# Patient Record
Sex: Male | Born: 1945 | Race: White | Hispanic: No | Marital: Married | Smoking: Former smoker
Health system: Southern US, Community
[De-identification: ages and names within clinical notes are randomized; demographics above are authoritative.]

## PROBLEM LIST (undated history)

## (undated) DIAGNOSIS — D509 Iron deficiency anemia, unspecified: Secondary | ICD-10-CM

## (undated) DIAGNOSIS — N201 Calculus of ureter: Secondary | ICD-10-CM

## (undated) DIAGNOSIS — N200 Calculus of kidney: Secondary | ICD-10-CM

## (undated) DIAGNOSIS — Z87448 Personal history of other diseases of urinary system: Secondary | ICD-10-CM

## (undated) DIAGNOSIS — I739 Peripheral vascular disease, unspecified: Secondary | ICD-10-CM

## (undated) DIAGNOSIS — N289 Disorder of kidney and ureter, unspecified: Secondary | ICD-10-CM

## (undated) DIAGNOSIS — Z794 Long term (current) use of insulin: Secondary | ICD-10-CM

## (undated) DIAGNOSIS — R531 Weakness: Secondary | ICD-10-CM

## (undated) DIAGNOSIS — M48061 Spinal stenosis, lumbar region without neurogenic claudication: Secondary | ICD-10-CM

## (undated) DIAGNOSIS — D75839 Thrombocytosis, unspecified: Secondary | ICD-10-CM

## (undated) DIAGNOSIS — N402 Nodular prostate without lower urinary tract symptoms: Secondary | ICD-10-CM

## (undated) DIAGNOSIS — L97919 Non-pressure chronic ulcer of unspecified part of right lower leg with unspecified severity: Secondary | ICD-10-CM

## (undated) DIAGNOSIS — E119 Type 2 diabetes mellitus without complications: Secondary | ICD-10-CM

## (undated) DIAGNOSIS — Z87442 Personal history of urinary calculi: Secondary | ICD-10-CM

## (undated) DIAGNOSIS — Z8619 Personal history of other infectious and parasitic diseases: Secondary | ICD-10-CM

## (undated) DIAGNOSIS — D473 Essential (hemorrhagic) thrombocythemia: Secondary | ICD-10-CM

## (undated) DIAGNOSIS — IMO0002 Reserved for concepts with insufficient information to code with codable children: Secondary | ICD-10-CM

## (undated) DIAGNOSIS — N1330 Unspecified hydronephrosis: Secondary | ICD-10-CM

## (undated) DIAGNOSIS — I10 Essential (primary) hypertension: Secondary | ICD-10-CM

---

## 2006-08-07 ENCOUNTER — Emergency Department (HOSPITAL_COMMUNITY): Admission: EM | Admit: 2006-08-07 | Discharge: 2006-08-07 | Payer: Self-pay | Admitting: Emergency Medicine

## 2007-03-13 ENCOUNTER — Inpatient Hospital Stay (HOSPITAL_COMMUNITY): Admission: EM | Admit: 2007-03-13 | Discharge: 2007-03-17 | Payer: Self-pay | Admitting: Emergency Medicine

## 2007-03-15 ENCOUNTER — Encounter (INDEPENDENT_AMBULATORY_CARE_PROVIDER_SITE_OTHER): Payer: Self-pay | Admitting: Specialist

## 2007-03-15 HISTORY — PX: OTHER SURGICAL HISTORY: SHX169

## 2007-04-11 ENCOUNTER — Encounter (HOSPITAL_BASED_OUTPATIENT_CLINIC_OR_DEPARTMENT_OTHER): Admission: RE | Admit: 2007-04-11 | Discharge: 2007-04-29 | Payer: Self-pay | Admitting: Surgery

## 2007-07-28 ENCOUNTER — Inpatient Hospital Stay (HOSPITAL_COMMUNITY): Admission: EM | Admit: 2007-07-28 | Discharge: 2007-07-30 | Payer: Self-pay | Admitting: Emergency Medicine

## 2007-08-19 ENCOUNTER — Ambulatory Visit: Payer: Self-pay | Admitting: Cardiology

## 2007-08-19 ENCOUNTER — Inpatient Hospital Stay (HOSPITAL_COMMUNITY): Admission: EM | Admit: 2007-08-19 | Discharge: 2007-08-26 | Payer: Self-pay | Admitting: Emergency Medicine

## 2007-08-21 ENCOUNTER — Ambulatory Visit: Payer: Self-pay | Admitting: Surgery

## 2007-08-24 ENCOUNTER — Encounter (INDEPENDENT_AMBULATORY_CARE_PROVIDER_SITE_OTHER): Payer: Self-pay | Admitting: Internal Medicine

## 2007-08-24 HISTORY — PX: TRANSTHORACIC ECHOCARDIOGRAM: SHX275

## 2007-08-25 ENCOUNTER — Ambulatory Visit: Payer: Self-pay | Admitting: Internal Medicine

## 2007-09-29 ENCOUNTER — Ambulatory Visit: Payer: Self-pay | Admitting: Surgery

## 2007-09-29 ENCOUNTER — Inpatient Hospital Stay (HOSPITAL_COMMUNITY): Admission: EM | Admit: 2007-09-29 | Discharge: 2007-10-04 | Payer: Self-pay | Admitting: Emergency Medicine

## 2007-10-06 ENCOUNTER — Encounter (HOSPITAL_BASED_OUTPATIENT_CLINIC_OR_DEPARTMENT_OTHER): Admission: RE | Admit: 2007-10-06 | Discharge: 2007-10-25 | Payer: Self-pay | Admitting: Surgery

## 2007-10-31 ENCOUNTER — Encounter (HOSPITAL_BASED_OUTPATIENT_CLINIC_OR_DEPARTMENT_OTHER): Admission: RE | Admit: 2007-10-31 | Discharge: 2008-01-29 | Payer: Self-pay | Admitting: Surgery

## 2008-02-03 ENCOUNTER — Encounter (HOSPITAL_BASED_OUTPATIENT_CLINIC_OR_DEPARTMENT_OTHER): Admission: RE | Admit: 2008-02-03 | Discharge: 2008-03-15 | Payer: Self-pay | Admitting: Surgery

## 2008-10-01 ENCOUNTER — Encounter: Payer: Self-pay | Admitting: Internal Medicine

## 2008-10-19 ENCOUNTER — Encounter: Payer: Self-pay | Admitting: Internal Medicine

## 2008-11-19 ENCOUNTER — Encounter: Payer: Self-pay | Admitting: Internal Medicine

## 2008-12-17 ENCOUNTER — Encounter: Payer: Self-pay | Admitting: Internal Medicine

## 2011-03-03 NOTE — Assessment & Plan Note (Signed)
[facility]   Miguel Leblanc, Miguel Leblanc                 ACCOUNT NO.:  [id number]   MEDICAL RECORD NO.:  [id number]      DATE OF BIRTH:  June 02, 1946   PHYSICIAN:  Theresia Majors. Tanda Rockers, M.D. VISIT DATE:  03/09/2008                                   OFFICE VISIT   SUBJECTIVE:  Miguel Leblanc is a 65 year old man who we are following for  multiple Wagner 1 diabetic foot ulcers.  In the interim, we have treated  him with antibacterial soap and offloading inserts.  He continues to be  ambulatory.  There has been no swelling, no pain, and no malodor.   OBJECTIVE:  Blood pressure is 118/71, respirations 18, pulse rate of 88,  and temperature 97.6.  Capillary blood glucose is 145 mg%.  Inspection  of the lower extremity shows that there is no edema.  There are readily  palpable bilateral dorsalis pedis pulses.  Wounds #4, #7, and #9 are  completely re-epithelialized.  Wounds #14 and #15 have healthy-appearing  eschars with minimum residual.  There is no evidence of active  infection.  There is no malodor.   ASSESSMENT:  Clinical improvement of Wagner 1 ulcers related to adequate  foot protection and offload.   PLAN:  We are discharging the patient with instructions for him to  continue the local care with antibacterial soap and continued use of his  custom orthotics with white cotton socks to be changed twice daily.  We  have given the patient opportunity to ask questions.  He seems to  understand the instructions and indicates that he will be compliant.  We  are discharging him from active followup in the [facility].  We have  instructed him to keep all of his appointments with Dr. Lacie Scotts at the  [facility].  The patient seems to understand.  He  expresses gratitude having been seen in the clinic and indicates that he  will be compliant.      Harold A. Tanda Rockers, M.D.  Electronically Signed     HAN/MEDQ  D:  03/09/2008  T:  03/09/2008  Job:  [id number]   cc:   Evelene Croon

## 2011-03-03 NOTE — H&P (Signed)
Miguel Leblanc, Miguel Leblanc                 ACCOUNT NO.:  [id number]   MEDICAL RECORD NO.:  [id number]          PATIENT TYPE:  INP   LOCATION:  6735                         FACILITY:  [facility]   PHYSICIAN:  Wilson Singer, M.D.DATE OF BIRTH:  1946/04/19   DATE OF ADMISSION:  07/28/2007  DATE OF DISCHARGE:                              HISTORY & PHYSICAL   Patient is unassigned.   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  He has been followed in the wound clinic since then but he  comes in now because he gives about a 9 day history of right upper back  pain which was excruciatingly painful and he says that he was not able  to get comfortable.  He also did notice that he passed what he felt was  a kidney stone in the last week or so.  He also felt that he had some  hematuria that he saw.  He now comes in for treatment of this condition.  He had seen his primary care physician who treated him with  ciprofloxacin thinking that he had a urinary tract infection.  When he  presented to the emergency room, a CT scan of the abdomen was done which  showed bilateral kidney stones and mild left hydronephrosis which was  likely due to passage of left-sided kidney stones.   PAST MEDICAL HISTORY:  Diabetes type 2 for approximately 5 to 7 years.   PAST SURGICAL HISTORY:  Right 4th toe amputation as mentioned above in  May 2008.   MEDICATIONS:  1. Lantus insulin.  He says he takes about 25 units a day.  2. He does not take Glipizide 5 mg b.i.d. that he had been taking      before and was sent home on when he was discharged at the end of      May 2008.   ALLERGIES:  None.   SOCIAL HISTORY:  He has been married for about 27 years.  He does not  smoke or drink alcohol to any significant degree.  He works as a Psychologist, occupational.   FAMILY  HISTORY:  Noncontributory.   REVIEW OF SYSTEMS:  Apart from the symptoms mentioned above, there were  no other symptoms referral to all systems reviewed.   PHYSICAL EXAMINATION:  VITAL SIGNS:  Temperature 96.7, blood pressure  115/84, pulse 100, respiratory rate 12, saturation 100%.  GENERAL:  He does not appear to be in any pain at the present time.  He  does look clinically dehydrated.  CARDIOVASCULAR:  Heart sounds are present and normal.  RESPIRATORY:  Lung fields are clear.  ABDOMEN:  Soft and nontender.  No hepatosplenomegaly.  NEUROLOGICAL:  Alert and oriented with no focal neurologic signs.   INVESTIGATIONS:  Urinalysis is positive for a small amount of blood.  Urine glucose is greater than 1000.  There is minimal proteinuria.  Sodium 124, potassium 4.0, chloride 88.  BUN 22, glucose 317, creatinine  0.9, lipase 11.  CT scan findings as above.   IMPRESSION:  1. Right upper back pain.  This probably represents his pain related      to renal colic.  2. Clinical dehydration.  3. Uncontrolled insulin dependent diabetes mellitus type 2.  4. Hyponatremia secondary to dehydration and uncontrolled diabetes.   PLAN:  1. Admit.  2. Intravenous fluids with normal saline.  3. Controlled diabetes mellitus with Lantus insulin, sliding scale of      insulin.  4. Urology consultation.  5. Check a chest x-ray and D-Dimer to make sure that his right upper      chest pain is not related to something else such as a pulmonary      embolism.  6. Check electrocardiogram.  7. Further recommendations will depend on patient's hospital progress.      Wilson Singer, M.D.  Electronically Signed     NCG/MEDQ  D:  07/28/2007  T:  07/29/2007  Job:  [id number]

## 2011-03-03 NOTE — Assessment & Plan Note (Signed)
[facility]   Miguel Leblanc                 ACCOUNT NO.:  [id number]   MEDICAL RECORD NO.:  [id number]      DATE OF BIRTH:  1946-08-24   PHYSICIAN:  Theresia Majors. Tanda Rockers, M.D.      VISIT DATE:                                   OFFICE VISIT   SUBJECTIVE:  Miguel Leblanc is a 65 year old diabetic with multiple Wagner 1  foot ulcers.  He has been using an antibacterial/antiseptic soap  washing, wearing a white sock with  offloading sandals.  He returns for  followup.  There has been decreasing area in the wounds.  There has been  no excessive drainage, malodor, pain, or fever.  He reports that there  is a new area that has rubbed on the left lower extremity, however.  He  continues to be ambulatory.  His sugars have been under reasonable  control.   OBJECTIVE:  Blood pressure is 114/75, respirations 16, pulse rate 87,  and temperature 97.9.  Capillary blood glucose is 145 mg percent, and  inspection of the lower extremities shows that all of the wounds have  clinically improved with varying amounts of healthy-appearing  granulation with advancing epithelium.  Several areas have healthy-  appearing eschars.  The newest wound #15 is located on the medial ankle  extends into the subcutaneous tissue.  No debridement is needed.  There  is associated healthy-appearing eschar with scant drainage.  There is no  evidence of ascending infection, cellulitis, or abscess.  The dorsalis  pedis pulses are bilaterally palpable.  There is no evidence of  associated ischemia.  Wounds 4, 7, 8, 9, 12, 13, 14, and 15 have been  measured, photographed, and cataloged.  Please refer to the data  entries.   ASSESSMENT:  Clinical improvement with local hygiene and offloading.   PLAN:  We have instructed the patient to continue the use of  antibacterial soap.  He is to continue the use of the clean cotton  socks.  We have inspected his custom inserts, and they appear to be  adequate.  We  have encouraged the patient to begin wearing the shoes  with the inserts.  We will reevaluate him in 2 weeks to assess his  response.  He is to continue to inspect his feet daily and to  discontinue the use of his custom orthotics if he notices any increase  in ulceration.  We have given him an opportunity to ask questions.  He  seems to understand and indicates that he will be compliant.      Harold A. Tanda Rockers, M.D.  Electronically Signed     HAN/MEDQ  D:  02/07/2008  T:  02/08/2008  Job:  [id number]
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
## 2011-03-03 NOTE — H&P (Signed)
NAME:  Miguel Leblanc, Miguel Leblanc                 ACCOUNT NO.:  [id number]   MEDICAL RECORD NO.:  [id number]          PATIENT TYPE:  INP   LOCATION:  1833                         FACILITY:  [facility]   PHYSICIAN:  Wilson Singer, M.D.DATE OF BIRTH:  May 04, 1946   DATE OF ADMISSION:  03/13/2007  DATE OF DISCHARGE:                              HISTORY & PHYSICAL   HISTORY:  This is a very pleasant 65 year old man who has type 2 insulin-  dependent diabetes mellitus who now presents with redness and swelling  in the right foot involving the fourth and fifth toes.  He noticed this  a few days ago, and there was a blistering in this area which then  proceeded to progress to a redness and pain.  He has not been febrile.  He has no previous history of ulcers.  He went to the urgent care center  where x-ray of the right foot was done which was suggestive of  osteomyelitis in the right fourth phalanx.  He therefore now comes to  the hospital for admission for treatment of this.   PAST MEDICAL HISTORY:  1. Diabetes type 2 for approximately 5-7 years.  2. No history of coronary artery disease or cerebral vascular disease.   PAST SURGICAL HISTORY:  No operations.   MEDICATIONS:  Lantus 25-30 units per day.  He also takes oral  hyperglycemic medications the name of which he can not remember.   ALLERGIES:  None.   SOCIAL HISTORY:  He has been married for 27 years.  He does not smoke  and does not drink alcohol to any significant degree.  He works as a  Psychologist, occupational.   FAMILY HISTORY:  Noncontributory.   REVIEW OF SYSTEMS:  A part from the symptoms mentioned above, there are  no other symptoms referable to all systems reviewed.   PHYSICAL EXAMINATION:  VITAL SIGNS:  Temperature 96.7, blood pressure  119/74, pulse 80, respiratory rate 12, saturation 97%.  GENERAL:  He looks systemically well and is not clinically toxic.  CARDIOVASCULAR EXAMINATION:  Heart sounds are present and normal.  There  are no carotid  bruits.  There are no murmurs.  RESPIRATORY:  Lung fields are clear.  ABDOMEN:  Soft, nontender with no hepatosplenomegaly.  NEUROLOGICAL:  He is alert and oriented with no focal neurological  signs.  MUSCULOSKELETAL EXAMINATION:  Right foot shows obvious evidence of  inflammation in the right foot involving the fourth and fifth toes with  surrounding cellulitis in this area clinically.   INVESTIGATIONS:  X-ray of the right foot is consistent with  osteomyelitis of both phalanx.   IMPRESSION:  1. 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  2. Type 2 insulin-dependent diabetes mellitus.   PLAN:  1. Admit.  2. Intravenous antibiotics with Zosyn.  3. MRI of the right foot to further delineate the extent of      osteomyelitis.  4. Consider orthopedic consultation.  5. Control diabetes.   Further recommendations will depend on patient's progress, and we will  also get wound care involved.  He will have diabetes instruction and  diet counseling.  Wilson Singer, M.D.  Electronically Signed     NCG/MEDQ  D:  03/13/2007  T:  03/13/2007  Job:  [id number]
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
## 2011-03-03 NOTE — Assessment & Plan Note (Signed)
[facility]   NAMEWINSTON, Miguel Leblanc                 ACCOUNT NO.:  [id number]   MEDICAL RECORD NO.:  [id number]      DATE OF BIRTH:  11-14-1945   PHYSICIAN:  Theresia Majors. Tanda Rockers, M.D. VISIT DATE:  11/15/2007                                   OFFICE VISIT   SUBJECTIVE:  Miguel Leblanc is a 65 year old who we have followed for  multiple Wagner grade 2 diabetic foot ulcers involving his lower  extremities.  He has been instructed to continue daily antiseptic soap  washing, thorough drying and wear white socks with offloading sandals.  He reports that he has been compliant and there has been no excessive  drainage, no malodor, no pain and no fever.  He is accompanied by his  son.   OBJECTIVE:  Blood pressure is 118/72, respirations 18, pulse rate 98,  temperature 98, capillary blood glucose 260 mg percent.  Inspection of  his lower extremities shows uniformly that the eschars are well  adherent, dry with no drainage whatsoever.  The pedal pulses remain 3+.  There is no evidence of ascending infection, cellulitis or abscess.  All  wounds were numbered, cataloged and entered into the database.  Please  refer directly for photos and measurements.   ASSESSMENT:  Clinical improvement with foot protection and offloading.   PLAN:  We will continue antiseptic soap washing, offloading healing  sandals.  The patient is in the process of procuring custom orthotics  with inserts.  We will reevaluate him in 3 weeks.  In the interim, if he  develops breakdown, malodorous drainage, redness, or is concerned in any  way he will call the clinic for an interim evaluation.      Harold A. Tanda Rockers, M.D.  Electronically Signed     HAN/MEDQ  D:  11/15/2007  T:  11/15/2007  Job:  [id number]

## 2011-03-03 NOTE — Discharge Summary (Signed)
NAMEHOWELL, Leblanc                 ACCOUNT NO.:  [id number]   MEDICAL RECORD NO.:  [id number]          PATIENT TYPE:  INP   LOCATION:  1407                         FACILITY:  [facility]   PHYSICIAN:  Thomasenia Bottoms, MDDATE OF BIRTH:  [birth year]   DATE OF ADMISSION:  08/19/2007  DATE OF DISCHARGE:                               DISCHARGE SUMMARY   DATE OF DISCHARGE:  Possibly August 26, 2007.   For admission details please see H&P.  Briefly [name] is a 65-year-  old gentleman who presented to the hospital after a fall.  He also had  fevers and chills.  He was found to be septic, was hypotensive, and  admitted to the ICU.  He required vasopressors intermittently.  His  blood cultures and urine cultures both grew methicillin-sensitive staph  aureus.  The patient was placed on vancomycin initially.  He received  aggressive care in the ICU and improved greatly.  He was able to be  moved out of the ICU onto a medical floor.  The source of his staph  aureus bacteremia was felt likely to be right olecranon bursitis.  The  patient did have an extensive workup because he was complaining of  severe right hip pain.  He had an I&D of the right hip which was  negative for any septic joint.  The patient did have an echocardiogram  which did not reveal any evidence for endocarditis.  No other clear  source of infection was found.  He was seen in consultation during this  hospital stay by infectious disease and Dr. Thomasena Edis of orthopedics.  By  the day of discharge the patient is ambulating in the halls.  He is  eating very well.  He has only minor aches and pains.  He is  independent, and he is afebrile.  His white count also has come back to  normal.  At this juncture we feel it is safe for discharge.  He did have  significant trouble with elevated blood sugars during this hospital  stay, but that is improving by the time of discharge as well.  Because  this was staph aureus bacteremia,  infectious diseases is recommending a  total of four weeks of IV antibiotics.  Since it is methicillin-  sensitive, nafcillin will be adequate.  The patient did have a PICC line  put in place, and home health will be arranged to give the IV  antibiotics at home.   DISCHARGE DIAGNOSES:  1. Methicillin-sensitive Staphylococcus aureus bacteremia,      Staphylococcus aureus urinary tract infection.  Right olecranon      bursitis with surrounding cellulitis likely the source of the      Staphylococcus aureus.  2. Severe sepsis with septic shock requiring vasopressors resolved.  3. Acute renal failure resolved.  4. Diabetes mellitus uncontrolled.  5. History of diabetic foot infection.  6. Dyslipidemia.  7. Degenerative joint disease.  8. Anemia.  9. History of nephrolithiasis.  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   SUMMARY OF STUDIES DONE:  Studies done during his hospital stay include  a right hip x-ray, fluoroscopy guided I&D of the right hip, portable  chest x-ray, renal ultrasound, x-ray of the right elbow, and CT scan of  the abdomen and pelvis as well as a CT scan of the  chest and multiple  chest x-rays.  His echocardiogram revealed normal LV function with an EF  55-65%, no regional wall motion abnormalities and no valvular  abnormalities with normal left and right ventricular sizes.   DISCHARGE MEDICATIONS:  1. Lantus insulin 30 units subcu q.h.s.  2. Aspirin 325 mg p.o. daily.  3. Diovan 80/12.5 mg p.o. every morning.  4. Avandia 8 mg p.o. every morning.  5. Janumet 50/100 one p.o. b.i.d.  6. Ferrous sulfate 325 mg p.o. b.i.d.  7. Ancef 2 g IV q.8 h.  The last day should be Friday, February 28.   The patient should follow up with Dr. Lacie Scotts, his primary care  physician, in 10-15 days and sooner if he has any problems.      Thomasenia Bottoms, MD  Electronically Signed     CVC/MEDQ  D:  08/25/2007  T:  08/26/2007  Job:  [id number]   cc:   Evelene Croon  Fax: [phone]

## 2011-03-03 NOTE — Assessment & Plan Note (Signed)
[facility]   NAMELEDELL, CODRINGTON                 ACCOUNT NO.:  [id number]   MEDICAL RECORD NO.:  [id number]      DATE OF BIRTH:  1946-06-30   PHYSICIAN:  Theresia Majors. Tanda Rockers, M.D. VISIT DATE:  11/01/2007                                   OFFICE VISIT   SUBJECTIVE:  Mr. Worrel is a 65 year old man who we have followed for  Wagner 2 diabetic foot ulcers involving his lower extremities.  In the  interim we have instructed him to use antiseptic soap, wash twice daily  with thorough drying, and continue the use the healing sandals.  He has  done so.  There has been no excessive drainage, malodor, pain or fever.   OBJECTIVE:  He is accompanied by his son.  Blood pressure is 122/73,  respirations 16, pulse of 102, temperature 9.6, and capillary blood  glucose 200 mg percent.  Inspection of the feet shows that the eschars  continued to be well adherent with no evidence of liquefaction or  separation.  Wounds 7,8,9,3,4 and 5 are healthy in appearance with no  evidence of infection.  Wounds 6 and 10 are resolved.  Please refer to  the data entries for measurements and photographs.His pedal pulses  remain 3+ bilaterally.   ASSESSMENT:  Clinical improvement of Wagner 2 diabetic foot ulcers.   PLAN:  We will continue daily antiseptic soap washes, cotton socks and  offloading healing sandals.  We will reevaluate the patient in 2 weeks.      Harold A. Tanda Rockers, M.D.  Electronically Signed     HAN/MEDQ  D:  11/01/2007  T:  11/01/2007  Job:  [id number]

## 2011-03-03 NOTE — Assessment & Plan Note (Signed)
[facility]   NAME:  Miguel Leblanc, Miguel Leblanc                 ACCOUNT NO.:  [id number]   MEDICAL RECORD NO.:  [id number]      DATE OF BIRTH:  1946-01-31   PHYSICIAN:  Theresia Majors. Tanda Rockers, M.D. VISIT DATE:  10/10/2007                                   OFFICE VISIT   SUBJECTIVE:  [name] is a 65 year old man who was last seen in the  wound center on 07/27/07.  At that time he had resolved a Wagner four  diabetic foot ulcer with conservative treatment including offloading.  He has been asked to be seen in the wound center for a recurrence of  bilateral neuropathic ulcerations associated with a recent admission to  the [facility].   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   RECOMMENDATIONS:  Continue on doxycycline 100 mg p.o. b.i.d.  Continue  his diabetic management per Dr. Lacie Scotts.  We we have provided the  patient with adequate offloading healing sandals for both feet and have  instructed him in the use of the same.  He is to continue to use  antiseptic soap wash daily with thorough drying and daily  inspection of  his feet.  We anticipate some minor slough with liquefaction necrosis  but we will continue to monitor this on a weekly basis.   SUBJECTIVE:  The patient is a 65 year old diabetic who has been recently  admitted to [facility] with changes in mental status attendant  with uncontrolled diabetes.  During that admission it was noted that he  had blisters on both feet, was seen by the in-house wound care service  and underwent local treatment.  He was subsequently discharged with a  plan of management of his diabetes initiated and now under the  management of Dr. Lacie Scotts.  He was referred to the wound center for  evaluation of his wound.   DISCHARGE MEDICATIONS:  1. Aspirin 325 mg daily.  2. Janumet 50/ 1000 twice a day.  3. Doxycycline 100 mg b.i.d.  4. Avandia 80 mg a day.  5. Lantus 10/40 8 units at bedtime.  6.  Lipitor 10 mg daily.  7. Cymbalta 60 mg daily.  8. Cipro 500 mg b.i.d.  9. Diovan hydrochlorothiazide 80/12.5 mg daily.   Since his discharge the patient continues to be ambulatory.  He was  given a comprehensive diabetic instruction during his hospital stay.   OBJECTIVE:  VITALS:  Blood pressure is 109/61, respirations 16, pulse  rate 100, temperature is 97.5.  His capillary blood glucose is 314 mg  percent this morning.  He is accompanied by his nephew.  Inspection of  both of his lower extremities discloses new wounds which were number 3,  4, 5, 6, 7, 8, 9 and 10.  These wounds were photographed characteristic  of each of these wounds is the fact that they represent blisters. They  are full-thickness.  There is well adherent eschar.  There is no  evidence of looseness, separation or liquefaction necrosis at this  point.  There is no ascending infection, abscess or cellulitis.  The  pedal pulses are 3+ bilaterally.  There is evidence of previous old  injuries.  The patient is absolutely insensate to the signs Weinstein  filament.  ASSESSMENT:  Wagner grade 2 diabetic foot ulcers multiple associated  with malfitting footwear.   PLAN:  We have given the patient a prescription for custom inserts. In  the meantime, we have discontinued the use of his Darco wedge healing  sandal and placed him in a flat healing sandal with offloading of both  metatarsals.  We have instructed the patient in the use of protective  foot wear, particularly offloading healing sandals. He seems to  understand.  We have also strongly encouraged him to keep his  appointments with his diabetologist, Dr. Lacie Scotts.  We have given the  patient and his nephew an opportunity to ask questions.  They seem to  understand the aforementioned items.  They express gratitude for having  been seen in the clinic and indicate that they will be compliant.      Harold A. Tanda Rockers, M.D.  Electronically Signed      HAN/MEDQ  D:  10/10/2007  T:  10/10/2007  Job:  [id number]   cc:   Shireen Quan, M.D.

## 2011-03-03 NOTE — Assessment & Plan Note (Signed)
[facility]   NAMERAVINDRA, BARANEK NO.:  [id number]   MEDICAL RECORD NO.:  [id number]      DATE OF BIRTH:  Jun 30, 1946   PHYSICIAN:  Maxwell Caul, M.D. VISIT DATE:  04/26/2007                                   OFFICE VISIT   LOCATION:  [facility].   Purpose of today's visit follow-up of post surgical wound and what was a  Wegener's grade 4 diabetic foot ulcer.  He was treated last visit here  with Iodosorb ointment, dry dressing,  antiseptic soap washes.   PHYSICAL EXAM:  The patient returns today. The right fourth toe  amputation site has totally resolved.  I did remove a small amount of  black eschar.  There was no open wound under this; the skin underneath  appears to be well-healed.  No evidence of pain or drainage in the area.  I have declared this area resolved.  The patient pointed out to me that  on his left hand, he has a series of areas on the lateral aspect of the  hand across two of his metacarpal phalangeal joints.  He describes these  as starting as blisters and they have opened.  These are dry and appear  well their way to healing.  Exactly what has precipitated this, I do not  know. He says this has been a recurrent problem.  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 However, what he has looks a lot like  eczema.  Nevertheless, this is on its way to resolving.   IMPRESSION:  1. Postsurgical fourth toe amputation site wound and what was      initially probably a Wegener's grade 4 wound.  This has totally      resolved.  I did talk to him about diabetic foot wear and offered      to write him a prescription. However, he seems less than enthused      about this.  I have advised him to discuss this with his primary      physician who in [place].  2. Probably eczema on his left hand.  For now, I have just applied      Polysporin advised him to do the same and  these appear to be well      on their  way to resolving without further problems.  Again, the      exact pathogenesis of what is on his hand is not totally clear to      me.   I have discharged him from this clinic.  His issues in the left hand can  be followed by his primary physician.           ______________________________  Maxwell Caul, M.D.     MGR/MEDQ  D:  04/26/2007  T:  04/26/2007  Job:  [id number]   cc:   Jene Every, M.D.

## 2011-03-03 NOTE — Op Note (Signed)
NAMESEDERICK, JACOBSEN NO.:  [id number]   MEDICAL RECORD NO.:  [id number]          PATIENT TYPE:  INP   LOCATION:  5029                         FACILITY:  [facility]   PHYSICIAN:  Jene Every, M.D.    DATE OF BIRTH:  09-Apr-1946   DATE OF PROCEDURE:  03/15/2007  DATE OF DISCHARGE:                               OPERATIVE REPORT   PREOPERATIVE DIAGNOSIS:  Infected, necrotic, diabetic right fourth toe.   POSTOPERATIVE DIAGNOSIS:  Infected, necrotic, diabetic right fourth toe.   PROCEDURE PERFORMED:  Amputation of right fourth toe at the  metatarsophalangeal joint.   ANESTHESIA:  General.   ASSISTANT:  None.   INDICATIONS:  A 65 year old noncompliant insulin-dependent diabetic who  developed an ulcer on his fourth toe secondary to malfitting shoe wear,  noncompliance with foot checks.  He was admitted with a diabetic foot  ulcer and infection by Dr. Pecolia Ades.  Noted on physical exam was an  ulcer with a condyle of the phalanx protruding through the skin.  There  was noted erythema and some drainage noted.  He was indicated for  amputation of the toe to remove infection and necrotic nonviable tissue.  Risks and benefits including bleeding, infection, need for further  revision including ultimately a below-knee amputation, etc.  He was  counseled on need for compliance to diabetic control, nutrition  parameters and daily foot checks.   __________.  The patient was placed in the supine position after  adequate general anesthesia and Zosyn.  His right lower extremity was  prepped and draped in the usual sterile fashion.  I elevated the leg and  inflated at thigh tourniquet to 300 mmHg.  I made an elliptical incision  around the base of the fourth digit through the skin in a fishmouth type  incision.  I then skeletonized the proximal phalanx.  It was noted to be  necrotic.  There was some necrotic drainage noted but no abscess or  frank pus noted.  This was  skeletonized to the MTP joint and the digit  was removed.  It was inspected ___________ necrotic and not viable.  At  the metatarsal head, though and after the subcutaneous tissue was  debrided, it appeared to be viable.  I copiously irrigated it.  I  performed a rotation type flap of the plantar skin to cover the wound  with an incision based at the web space of the third and delivered it  into the wound from plantarward to dorsally.  I placed a drain in the  depths of the wound and brought out through a separate stab wound of the  skin.  One deep incision with 2-0 Vicryl simple sutures and I closed the  skin edges with 4-0 nylon.  Prior to this, the tourniquet was deflated  and there was good vascularization of all tissue noted.  I felt the  pathologic tissue had been completely excised.   Next, the wound was dressed sterilely with a bulky dressing, secured  with an Ace bandage.  Tourniquet was deflated and there was adequate  vascularization of the lower extremity.  The patient tolerated the procedure with no complications.  Minimal  blood loss.      Jene Every, M.D.  Electronically Signed     JB/MEDQ  D:  03/15/2007  T:  03/15/2007  Job:  [id number]

## 2011-03-03 NOTE — H&P (Signed)
NAME:  Miguel Leblanc, Miguel Leblanc                 ACCOUNT NO.:  [id number]   MEDICAL RECORD NO.:  [id number]          PATIENT TYPE:  INP   LOCATION:  4733                         FACILITY:  [facility]   PHYSICIAN:  Hind I Elsaid, MD      DATE OF BIRTH:  [birth year]   DATE OF ADMISSION:  09/28/2007  DATE OF DISCHARGE:                              HISTORY & PHYSICAL   CHIEF COMPLAINT:  1. Presyncope.  2. Hypotension.   HISTORY OF PRESENT ILLNESS:  This is a 65 year old gentleman with a  history of diabetes mellitus on insulin which seems uncontrolled.  History of hypotension.  Admitted today to the hospital with chief  complaint of he had bilateral feet blisters which were noticed on Friday  1 week before the admission.  Two blisters started to broke up on both  lower extremities.  The patient has to see his primary care physician  today at the office where he found his blood pressure was mildly low.  Primary care physician prescribed some antibiotics and some saline to  clean the wound.  The patient went home.  Felt really weak and felt  sick, had a CBG of 540.  At that time, the patient felt dizzy and  collapsed on the floor, but he denies any loss of consciousness.  The  patient denies any seizure activity.  The patient denies any headache.  Denies any numbness or weakness of his extremities.  The patient denies  any nausea, vomiting or abdominal pain.  EMS was called.  The patient  was found to be hypotensive and tachycardic with a CBG of 218.  The  patient was lethargic on arrival.  Radial pulse unobtainable.  Accordingly, the patient was given a fluid bolus, legs elevated and  blood pressure began to rise.  The patient seen in the ED, blood  pressure improved to 106/64.  At that time, the patient denies any  further symptoms.   PAST MEDICAL HISTORY:  1. Methicillin-resistant Staphylococcus aureus bacteremia.  Staph      aureus urinary tract infection.  2. Right olecranon bursitis, status post IV  antibiotic.  3. Severe sepsis with sepsis shock requiring vasopressor.  4. Acute renal failure.  5. Diabetes mellitus, uncontrolled.  6. History of diabetic foot infection.  7. Dyslipidemia.  8. Degenerative joint disease.  9. Anemia.  10.History of nephrolithiasis.  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   SOCIAL HISTORY:  The patient is married.  He chews tobacco.  Denies any  alcohol.  Denies any IV drug abuse.   FAMILY HISTORY:  Diabetes mellitus in his mother.  Peripheral arterial  disease in his father who had several vascular accidents and  hypertension.  No history of cancer in his family that they know of.   REVIEW OF SYSTEMS:  As per HPI.   PHYSICAL EXAMINATION:  VITAL SIGNS:  Temperature 97, blood pressure  74/52, pulse rate 99, respiratory rate 17, saturating 97% on room air.  HEENT:  Normocephalic, atraumatic.  Pupils are equal, round, and  reactive to light.  Extraocular muscle movements are intact.  Funduscopic benign.  Oropharynx with dry oral mucosa.  No exudate.  Good  dentition.  NECK:  Full range of motion.  No thyromegaly, adenopathy or JVD.  CARDIOVASCULAR:  S1 and S2, no murmur, rub or gallop.  LUNGS:  Clear to auscultation bilaterally.  No rales, wheezes or  rhonchi.  ABDOMEN:  Bowel sounds are positive.  Soft, nontender and nondistended.  EXTREMITIES:  There are some old scars on both shins.  Bilateral foot  ulcer 3 x 5 cm on the plantar surface of the left foot.  Also there are  some diabetic foot ulcers on the second toe.  History of amputation of  the fourth right toe.  Peripheral pulses were intact bilaterally.  NEUROLOGIC:  The patient is alert and oriented x three.  He is able to  move all his extremities.   LABORATORY DATA:  Urinalysis was negative leukocyte or nitrate.  CMP:  Sodium 132, potassium 4, chloride 96, CO2 31, glucose 90, BUN 20,  creatinine 0.9.  CBC:  Hemoglobin 9.6, hematocrit 28.9, white blood  cells 7.7, platelet  261, cardiac enzymes were negative, occult blood was  negative.   ASSESSMENT/PLAN:  This is a 64 year old male with a history of  uncontrolled diabetes mellitus, history of methicillin-sensitive  Staphylococcus aureus, admitted to the hospital for evaluation of  presyncope which is most probably secondary to the hypotension.  Hypotension most probably secondary to underlying sepsis.   PROBLEM LIST:  1. Presyncope.  2. Hypotension.  3. Foot ulcer.  4. Uncontrolled diabetes mellitus.  5. Status post fall.   PLAN:  1. Admit the patient to telemetry as blood pressure is improved at      this time.  We will continue with IV fluid at 80 cc per hour.  We      will get septic workup.  We will get patient on vancomycin IV.  A 2-      D echo.  We will get ABI and x-ray of bilateral lower extremities      to rule out osteomyelitis.  We will consider infectious disease      consult.  2. For diabetes mellitus, we will get hemoglobin A1c.  Meanwhile, we      will continue with his home medications.  3. Anemia, we will get anemia panel.  We will continue with ferrous      sulfate.  4. Status post fall today.  We will get CT head without contrast.  At      this time, the patient is asymptomatic.  We will continue with DVT      and GI prophylaxis.      Hind Bosie Helper, MD  Electronically Signed     HIE/MEDQ  D:  09/29/2007  T:  09/29/2007  Job:  [id number]

## 2011-03-03 NOTE — Consult Note (Signed)
Miguel Leblanc, Miguel Leblanc                 ACCOUNT NO.:  [id number]   MEDICAL RECORD NO.:  [id number]          PATIENT TYPE:  REC   LOCATION:  FOOT                         FACILITY:  [facility]   PHYSICIAN:  Harold A. Tanda Rockers, M.D.DATE OF BIRTH:  July 03, 1946   DATE OF CONSULTATION:  04/11/2007  DATE OF DISCHARGE:                                 CONSULTATION   SUBJECTIVE:  Miguel Leblanc is a 65 year old diabetic who is referred by Dr.  Jene Every for evaluation of a wound to his fourth right toe.   IMPRESSION:  Status post radical debridement including the fourth toe  amputation for Wagner grade IV diabetic foot ulcer.   RECOMMENDATIONS:  Continue offloading and local care with Iodosorb paste  dressing.  The patient will need follow-up exam to determine resolution.   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  The patient noticed the insidious  development of discomfort and when he further inspected his toe he noted  that there was drainage and discoloration.  He was subsequently seen by  Dr. Shelle Iron and underwent an amputation of the fourth toe.  Since that  time he has continued daily wound care with visiting home nurse.  There  has been no excessive drainage, malodor or fever.  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  There has been  no impairment to his dexterity.  He continues to clean the hand wound  locally.   PAST MEDICAL HISTORY:  Is remarkable for the absence of allergies.   CURRENT MEDICATIONS:  1. Lantus 100 units as directed by his physician.  2. Glipizide 5 mg b.i.d.   His medical physician is Dr. Glenis Smoker in [place].  He denies previous  surgery.   FAMILY HISTORY:  Negative for diabetes, stroke, heart attack or cancer.   SOCIAL HISTORY:  He has never smoked.  Socially, he is married.  He and  his wife have two stepchildren.   REVIEW OF SYSTEMS:  Is remarkable for excellent exercise  tolerance.  There are no cardiorespiratory symptoms.  The bowel and bladder function  are described as normal.  The patient's weight is stable.  There has  been no transient paralysis, speech impediments.  The remainder review  of systems is negative.   PHYSICAL EXAM:  GENERAL:  He is an alert, oriented man in no acute  distress.  He is in good contact with reality and responding  appropriately to inquiry.  VITAL SIGNS:  Blood pressure is 136/90, respirations 18, pulse rate 87,  temperature is 97.6, capillary blood glucose was 200.  HEENT:  Clear.  NECK:  Supple.  Carotid bruits are not appreciated.  Trachea is midline.  Thyroid is nonpalpable.  LUNGS:  Clear.  HEART:  Sounds were normal.  ABDOMEN:  Soft.  EXTREMITIES:  Remarkable for healed re-epithelialization wounds over the  anterior and lateral shins. There are no inflammatory changes.  The  dorsalis pedis pulses 3+ bilaterally.  There is a postsurgical eschar  over the fourth toe  of the right foot.  It is well-healed.  There is  scant drainage. A full-thickness debridement was performed of the  superior aspect of this wound with findings of healthy-appearing  granulation tissue.  The patient is moderately insensate to the  [facility] filament. There is no regional adenopathy.  There are no  changes of advanced stasis.   DISCUSSION:  Miguel Leblanc apparently has had a Wagner grade 4 diabetic foot  ulcer which has been treated with radical debridement.  He now has a  minor open wound which will require at least to follow-ups in the wound  center to effect a cure.  We will place him on a Cadexomer iodine paste  to avoid further breakdown and secondary infection.  We have explained  this approach to the patient in terms that he seems to understand.  He  indicates his a willingness to be compliant.  We will reevaluate him in  one week.      Harold A. Tanda Rockers, M.D.  Electronically Signed     HAN/MEDQ  D:  04/11/2007  T:   04/11/2007  Job:  [id number]   cc:   Jene Every, M.D.  Dr. Glenis Smoker, [place]

## 2011-03-03 NOTE — Assessment & Plan Note (Signed)
[facility]   NAMEAJAY, STRUBEL                 ACCOUNT NO.:  [id number]   MEDICAL RECORD NO.:  [id number]      DATE OF BIRTH:  [birth year]   PHYSICIAN:  Theresia Majors. Tanda Rockers, M.D. VISIT DATE:  01/24/2008                                   OFFICE VISIT   SUBJECTIVE:  [name] is a 65 year old man who has been seen in the  Wound Center for the management of multiple lower extremity Wagner I and  II diabetic foot ulcerations associated with mal-fitting shoes and  blisters/burns.  He has been placed in offloading healing sandals and  has been utilizing topical antiseptic soap washes over the past several  weeks.  He returns for followup.  There has been no excessive drainage,  malodor, pain or fever.  In fact, the patient thinks that many of the  wounds have resolved.   OBJECTIVE:  VITAL SIGNS:  Blood pressure is 124/71, respirations 16,  pulse rate 85, temperature is 98.  The wounds were cataloged and numbered 3, 4, 5, 7, 8, 9, 11, 12, 13 and  14 (please refer to the data entries and photographs).  Summarily, wound  #14 originally penetrated into the fascia of the calcaneus.  This wound  has now completely granulated with the exception of a small area of full-  thickness necrosis.  The patient remains anesthetic and disallowed a  full-thickness excisional debridement into subcutaneous tissue including  the skin and chronic granulation.  The resulting hemorrhage was  controlled with direct pressure.  The pedal pulses remain readily  palpable and there is no significant edema.  Wounds that have resolved  are 3, 5, 7 and 12.   PLAN:  The patient will continue the local care with antiseptic soap  wash.  He will continue wearing the offloading healing sandals.  We will  reevaluate him in 2 weeks p.r.n.      Harold A. Tanda Rockers, M.D.  Electronically Signed     HAN/MEDQ  D:  01/24/2008  T:  01/24/2008  Job:  [id number]

## 2011-03-03 NOTE — Consult Note (Signed)
NAMEALFARD, Miguel Leblanc NO.:  [id number]   MEDICAL RECORD NO.:  [id number]          PATIENT TYPE:  INP   LOCATION:  5029                         FACILITY:  [facility]   PHYSICIAN:  Jene Every, M.D.    DATE OF BIRTH:  1945-12-05   DATE OF CONSULTATION:  03/14/2007  DATE OF DISCHARGE:                                 CONSULTATION   CHIEF COMPLAINT:  Right diabetic foot ulcer.   HISTORY:  A 65 year old male is admitted with a diabetic foot ulcer on  the right fourth toe.  He had noted a week or 2 of a malfitting shoe and  the development of an ulcer on the foot.  He is an insulin-dependent  diabetic with suboptimal control.  He was admitted to the hospital by  Dr. Birdena Jubilee and diagnosed with osteomyelitis with the digit.  We  ordered an MRI and placed him on Zosyn.  He has had no fevers or chills.  The patient was seen by Nutrition.  He was noted to have decreased  albumin and was not receptive to making changes for his diabetes or  diet.   PAST MEDICAL HISTORY:  Otherwise significant for insulin-dependent  diabetes,   CURRENT MEDICATIONS:  1. Lantus insulin.  2. Zosyn.  3. Lovenox p.r.n.  4. Zofran.   ALLERGIES:  HE REPORTS NO KNOWN DRUG ALLERGIES.   FAMILY HISTORY:  Noncontributory.   PHYSICAL EXAMINATION:  He is a thin male in no distress.  Mood and  affect are appropriate.  I removed the wound bandage on the right.  His  fourth toe is noted to be erythematous.  Between the web space of the  fourth and fifth and the third and fourth, there is macerated skin.  He  had a somewhat cyanotic third toe with decreased refill.  There was  decreased sensation there noted on the lateral aspect of the fourth toe  at the IP joint.  There was exposed condyle of the phalanx, and the bone  appeared necrotic.  There was necrotic tissue.  There was no purulence.  There was mild surrounding erythema, but no proximal cellulitis or  fluctuance within the foot.  There was no  DVT or lymphadenopathy  proximally.  He was able to dorsiflex and plantar flex the foot.  There  was 1+ dorsalis pedis and 2+ posterior tibial pulse.  He reported  sensation on the plantar aspect of the foot that was intact.   RADIOLOGY:  Radiographs of the foot demonstrate soft tissue air over the  lateral aspect of the phalanx on the fourth digit.  There was some  erosion noted, as well.   EKG:  Normal sinus rhythm.   LABORATORIES:  Hemoglobin A1C on admit was 11.7.  Potassium was 4.6, and  his blood glucose is 242.  Creatinine was 0.87.   IMPRESSION:  1. Diabetic foot ulcer, right, with necrotic fourth phalanx, with      exposed condylar bone, appearing being nonviable.  It was somewhat      cyanotic.  The third and the fifth appear to be viable.  Has  excellent pulses distally.  2. Poorly-controlled insulin-dependent diabetes.   PLAN:  1. Wound discussion with [name] with concern of his situation the      foot is clear , he will require an amputation of the fourth toe,      given that it is nonviable with exposed condyle, and the bone being      necrotic.  I do not feel there is proximal extension of that.      Apparently an MRI pending to rule that out, but again clinically      there is no evidence of that.  He is afebrile with a normal white      count.  Discussed the risks and benefits including need for more      proximal revision, and even a BK if he remains poorly controlled,      without adequate foot care, would proceed with that procedure.  2. Continue the Zosyn in the interim.  3. Elevation.  4. Blood glucose control.  5. Proceed when available operating room time is procured.      Jene Every, M.D.  Electronically Signed     JB/MEDQ  D:  03/14/2007  T:  03/14/2007  Job:  [id number]

## 2011-03-03 NOTE — Assessment & Plan Note (Signed)
[facility]   NAME:  JEREMIH, DEARMAS                 ACCOUNT NO.:  [id number]   MEDICAL RECORD NO.:  [id number]      DATE OF BIRTH:  03/24/1946   PHYSICIAN:  Theresia Majors. Tanda Rockers, M.D. VISIT DATE:  10/18/2007                                   OFFICE VISIT   SUBJECTIVE:  Mr. Richardson Dopp is a 65 year old man whom we are following for the  bilateral multiple Wagner grade 2 diabetic foot ulcers.  In the interim,  he has continued to wear a offloading healing sandal along with  exercising daily, hygiene utilizing antiseptic soap and clean cotton  socks.  There has been essentially no drainage, no fever.  No pain or  malodor.  His capillary blood glucose this morning was 66.   OBJECTIVE:  Blood pressure 107/73, respirations 16, pulse rate 101,  temperature 97.4. The patient is alert and oriented.  He is accompanied  by his nephew.  Inspection of the feet shows that there is trace edema.  The pedal pulses remain readily palpable.  The wounds numbered 3 through  10 are all clean with healthy well adherent eschars. There is no  evidence of ascending infection.   ASSESSMENT:  [address] II diabetic foot ulcers, healthy eschars   PLAN:  We will continue the patient in utilizing the protective foot  wear, the healing sandals. I have encouraged him to proceed with  orthotics appointment for custom inserts.  He is to continue to use the  antiseptic soap washing and white socks.  We have cautioned him not to  disturb the healthy eschars.  We will reevaluate him in 2 weeks.  If  there is any drainage or malodor or fever, he will call the clinic for  an interim appointment.  We have given him an opportunity to ask  questions.  He seems to understand the instructions and indicates that  he will be compliant      Jake Shark A. Tanda Rockers, M.D.  Electronically Signed     HAN/MEDQ  D:  10/18/2007  T:  10/18/2007  Job:  [id number]

## 2011-03-03 NOTE — Assessment & Plan Note (Signed)
[facility]   NAMEWARWICK, NICK                 ACCOUNT NO.:  [id number]   MEDICAL RECORD NO.:  [id number]      DATE OF BIRTH:  January 26, 1946   PHYSICIAN:  Theresia Majors. Tanda Leblanc, M.D. VISIT DATE:  12/20/2007                                   OFFICE VISIT   SUBJECTIVE:  Miguel Leblanc is a 65 year old diabetic whom we have followed  for multiple Wagner 2 diabetic foot ulcers attendant with thermal  injuries.  In the interim he has shown continued clinical improvement  utilizing daily antiseptic soap washes and offloading sandals with white  socks as dressings.  In the interim he continues to utilize the local  wound care as per above.  There has been no excessive drainage.  There  is been no malodor.  There is been no fever.  Due to recent inclement  weather, he has worn boots.   OBJECTIVE:  Blood pressure is 114/84, respirations 16, pulse rate 84,  temperature 97.5.  Capillary blood glucoses 240 mg percent.  Inspection of the lower extremity shows that all wounds show evidence of  contraction with maturation of the eschars.  There is no drainage.  There is no malodor.  Wounds number 3, 4, 5, 7, 8, 9, 11 and 12 all have  improved.  They are dry, well-adherent eschars.  No debridement is  needed.The pedal pulses remain +3 bilaterally.  There is no evidence of  infection.   ASSESSMENT:  Clinical improvement of Wagner 2 diabetic foot ulcers.   PLAN:  We will continue the local care.  We will reevaluate the patient  in 2 weeks.  He will see the nurse weekly.      Miguel Leblanc, M.D.  Electronically Signed     HAN/MEDQ  D:  12/20/2007  T:  12/20/2007  Job:  [id number]

## 2011-03-03 NOTE — Assessment & Plan Note (Signed)
[facility]   NAMESYLVESTER, MINTON                 ACCOUNT NO.:  [id number]   MEDICAL RECORD NO.:  [id number]      DATE OF BIRTH:  [birth year]   PHYSICIAN:  Theresia Majors. Tanda Rockers, M.D. VISIT DATE:  01/03/2008                                   OFFICE VISIT   SUBJECTIVE:  Mr. Jian is a 65 year old man who we have followed for  multiple Wagner II diabetic foot ulcers involving both feet as a result  of blisters.  He continues to be ambulatory.  He has discontinued  wearing the prescribed healing sandals and custom inserts.  He reports  that he has had a new area to develop on his right heel.  There has been  no interim fever.  His blood sugars have been under reasonable control.   OBJECTIVE:  VITALS:  Blood pressure is 108/58, respirations 16, pulse  rate 97, temperature 97.  EXTREMITIES:  Inspection of the lower extremity shows trace edema.  There is +3 bounding dorsalis pedis pulses bilaterally.  The right great  toe ulcer has resolved. Wound #4 right second toe has decreased  significantly with advancing epithelium and wound 5 has resolve,d wound  #7 on the right plantar surface has a healthy eschar. Wound #8 on the  right plantar area is the new area of injury which is hyperemic.  There  is no evidence of infection or extensive necrosis. No debridement was  needed.  Wound #9 on the right malleolus ankle is moderately edematous.  Nonfluctuant, non malodorous. There is no exudate.  This wound has been  rubbed by the boots that the patient is wearing.  Wound 11 on the left  first met head has a healthy eschar as does wound 12 on the left second  toe. Overall, there has been improvement of all of his previous wounds  with a reactivation of the wound on the heel.   ASSESSMENT:  Multiple Wagner II diabetic foot ulcers without evidence of  infection or ischemia.   PLAN:  We have taken additional time to counsel the patient regarding  the necessity of wearing the healing  sandals and to avoid wearing the  boots which have been noxious to the healing process.  The patient does  have in his possession custom orthotics which fit a pair of lace-up  shoes.  We have encouraged him to wear these orthotics rather than the  leather boots.  We have emphasized the potential of major amputation if  these ulcers develop deeper or they become infected.  We have given him  an opportunity to ask questions.  He seems to understand and indicates  that he will be compliant.  We will reevaluate the patient in 2 weeks.      Harold A. Tanda Rockers, M.D.  Electronically Signed     HAN/MEDQ  D:  01/03/2008  T:  01/03/2008  Job:  [id number]

## 2011-03-03 NOTE — Consult Note (Signed)
NAME:  Miguel Leblanc, Miguel Leblanc                 ACCOUNT NO.:  [id number]   MEDICAL RECORD NO.:  [id number]          PATIENT TYPE:  INP   LOCATION:  1225                         FACILITY:  [facility]   PHYSICIAN:  Sigmund I. Tannenbaum, M.D.DATE OF BIRTH:  06/08/1946   DATE OF CONSULTATION:  08/21/2007  DATE OF DISCHARGE:                                 CONSULTATION   HISTORY OF PRESENT ILLNESS:  This is a 65 year old male admitted on  August 19, 2007 with hypotension, fevers, elevated blood sugars into  the 500s and states that he had been passing kidney stones at home x10.  This patient is an established patient of Dr. Brunilda Payor with our office.  The  patient today is unresponsive and unable to answer questions.  Patient  has been recently discharged from hospital on Oct. 11, 2008.   The information gathered for this consult was found on admission H & P  by admssion MD with [facility] team.   PAST MEDICAL HISTORY SIGNIFICANT FOR:  1. Nephrolithiasis.  2. Bilateral nonobstructing stones.  3. Degenerative disk disease.  4. Poorly controlled diabetes.  Diabetes type 2 for approximately 5 to      seven years.   PAST SURGICAL HISTORY:  1. Amputation of right fourth toe.   MEDICATIONS INCLUDE TODAY:  1. Lovenox 40 mg subcu daily.  2. Aspirin 81 mg p.o. daily.  3. Senokot S 1 p.o. nightly.  4. Protonix 40 mg IV daily.  5. Ferrous sulfate 325 mg p.o. b.i.d.  6. Sliding scale insulin.  7. Vancomycin per pharmacy protocol.  8. Zosyn 3.375 g q.6 h.  9. Dopamine as titrated.   ALLERGIES:  None.   SOCIAL HISTORY:  Has been married x27 years.  Denies tobacco or alcohol.  He is a Psychologist, occupational.   FAMILY HISTORY:  Unknown.   REVIEW OF SYSTEMS:  Significant for poor appetite, poorly controlled  blood sugars, fevers. Unable to provide any other information due to  patient being obtunded.   PHYSICAL EXAMINATION:  GENERAL:  Shows a thin, cachectic male who is  unable to participate in interview process.  The  patient is obtuned.   VITAL SIGNS:  Today show temp of 99, heart rate of 103, blood pressure  136/70, respirations 21, saturation 83%-99%.   CARDIOVASCULAR:  Heart sounds are present and normal.   RESPIRATORY:  Diminished sounds bilaterally in the bases.   ABDOMEN:  Soft, nontender. Positive bowel sounds X 4.   GENITOURINARY:  Shows a Foley in the meatus.  Testicles are descended  bilaterally.   RECTAL:  Not performed at this time.   NEUROLOGIC:  Somnolent and unable to participate in the interview  process today.   LABS:  BMET shows a sodium of 131 (low), potassium 3.5, chloride 97, CO2 29,  glucose 74, BUN 12, and creatinine 0.94.   CBC today shows a white count of 8.7, RBC 2.75, hemoglobin is low at  7.5; hematocrit 22.5; and platelets 367.  The patient will be getting 2  units of packed rbc's today.   CT scan performed on August 20, 2007, shows there  are small bilateral  nonobstructing renal calculi.  No hydronephrosis.  There is mild  bilateral perinephric stranding, which appears increased since prior  study of July 28, 2007, right slightly greater than left, no proximal  stones are seen.  Liver, spleen, pancreas, adrenals,  gallbladder  unremarkable.  Bowel grossly unremarkable.  No free fluid, no free air,  or adenopathy.  There is basilar atelectasis.  Impression:  Small  nonobstructing bilateral renal calculi, no hydronephrosis, bilateral  renal perinephric stranding right greater than left,  increased since  prior study.  Pelvis without contrast, shows ureters are decompressed  and difficult to follow.  No stones visualized.  Foley catheter is  present in the bladder which is decompressed.  Bowel grossly  unremarkable.  No free air, no free fluid, no adenopathy, degenerative  changes in the lumber spine.  Impression:  Ureters decompressed and  difficult to follow no definitive stones, no acute findings in the  pelvis.   ASSESSMENT:  Bilateral nonobstructing  renal calculi with no  hydronephrosis noted, and mild bilateral perinephric stranding which  could be contributed to the fact that the patient states, from  admission, that he had been passing approximately 10 stones at home over  the last several days.   PLAN:  1. Place the patient on Uristat K 10 mEq 1 p.o. b.i.d.  2. At this point no other treatment options are necessary from a      urologic standpoint.  Please contact us if you need any further      assistance.  Will follow-up with patient in office at discharge.      Jetta Lout, NP      Sigmund I. Patsi Sears, M.D.  Electronically Signed    DW/MEDQ  D:  08/21/2007  T:  08/22/2007  Job:  [id number]

## 2011-03-03 NOTE — Discharge Summary (Signed)
NAMERAMON, ZANDERS NO.:  [id number]   MEDICAL RECORD NO.:  [id number]          PATIENT TYPE:  INP   LOCATION:  5029                         FACILITY:  [facility]   PHYSICIAN:  Marcellus Scott, MD     DATE OF BIRTH:  [birth year]   DATE OF ADMISSION:  03/13/2007  DATE OF DISCHARGE:  03/17/2007                               DISCHARGE SUMMARY   PRIMARY MEDICAL DOCTOR:  Dr. Lacie Scotts in [place], [place]; hence,  the patient is unassigned to the [facility].   DISCHARGE DIAGNOSES:  1. Infected, necrotic, diabetic right fourth toe, status post      amputation, probable osteomyelitis.  2. Uncontrolled diabetes.  3. Normocytic anemia.  4. Tobacco abuse.   DISCHARGE MEDICATIONS:  1. Vicodin 500/5 mg, 1 p.o. q.4 hourly p.r.n. for pain.  2. Keflex 500 mg p.o. four times a day for 3 days.  3. Vitamin C 500 mg p.o. daily.  4. Lantus 40 units subcutaneously at bedtime.  5. Glipizide 5 mg p.o. twice daily.   PROCEDURE:  1. On Mar 14, 2007 a chest x-ray.  Impression is COPD, no active      disease.  2. On Mar 13, 2007 an x-ray of the right foot.  Impression is soft      tissue air within the fourth phalanx with loss of the lateral      cortex within the proximal phalanx consistent with osteomyelitis.   PERTINENT LABORATORY RESULTS:  CBC with hemoglobin of 10.8, hematocrit  32.1, MCV 86.1, white cells 4.1, platelets of 293.  Basic metabolic  panel was remarkable for glucose of 333, BUN 7, creatinine 0.83, ESR of  84.  Lipid panel with a cholesterol of 111, triglycerides 71, HDL 27,  LDL 70, VLDL 14.  Blood culture is negative to date.  Final culture is  to be followed up as an outpatient.  Hemoglobin A1c of 11.7.  TSH of  2.679.   CONSULTATIONS:  Orthopedics from Dr. Jene Every.   HOSPITAL COURSE AND PATIENT DISPOSITION:  For details of the initial  part of the admission, please refer to the history and physical note  that was done by Dr. Karilyn Cota.  In  summary, Mr. Ditter is a 65 year old  patient with history of type-2 diabetes who presented with painful  swelling and redness of the right foot involving the fourth and the  fifth toes which was noticed a few days prior to admission.  He was  evaluated in the emergency room and was assessed to have a probable  right fourth toe osteomyelitis whereby he was admitted for further  evaluation and management.  1. Infected, necrotic, diabetic fourth toe with probable      osteomyelitis.  The patient was admitted to the hospital.  Blood      cultures were drawn which are negative to date.  He was placed on      IV antibiotics of Zosyn.  An orthopedics consultation was obtained.      Orthopedics subsequently proceeded to do an amputation of the right  fourth toe at the metatarsophalangeal joint.  They have continued      to follow the patient in the hospital.  His drain was taken out      today, and they have cleared him for discharge to follow up as an      outpatient with their offices.  They have also advised for dressing      changes by home health, a foot clinic appointment, and crutch      versus walker. PT has evaluated too.  2. Diabetes which was uncontrolled probably even prior to      hospitalization.  He was placed on Lantus insulin, the dose of      which has been increased.  He was also started on oral hypoglycemic      agents.  The glycemic control is better with sugars ranging between      126 to 270's today.  The patient is advised to check his CBGs      t.i.d. a.c. and at bedtime and be compliant with his medications      and follow up with his primary medical doctor closely.  His Lantus      insulin dose might have to be decreased once his infection settles      and his control is better.  He has also been provided with diabetes      education.  3. Normocytic anemia which has been stable.  For outpatient followup      and workup as deemed necessary.  4. Tobacco abuse.  The  patient is counseled regarding tobacco      cessation.      Marcellus Scott, MD  Electronically Signed     AH/MEDQ  D:  03/17/2007  T:  03/17/2007  Job:  [id number]   cc:   Isidoro Donning, M.D.

## 2011-03-03 NOTE — Assessment & Plan Note (Signed)
[facility]   NAMEANGELLO, CHIEN                 ACCOUNT NO.:  [id number]   MEDICAL RECORD NO.:  [id number]      DATE OF BIRTH:  [birth year]   PHYSICIAN:  Theresia Majors. Tanda Rockers, M.D.      VISIT DATE:                                   OFFICE VISIT   SUBJECTIVE:  Mr. Goswami is a 65 year old man who we have followed for  multiple Wagner I diabetic foot ulcers involving the lower extremities.  In the interim, we have treated him with antiseptic soap wash and take  offloading orthotic sandals.  He continues to be ambulatory.  There has  been no excessive pain, malodor, or drainage.   OBJECTIVE:  Blood pressure is 118/77, respirations 18, pulse rate 81,  and temperature 97.8.  Capillary blood glucose is 154 mg percent.  Inspection of the lower extremity shows that all the wounds have  improved.  The wound #8 has resolved completely with 100%  epithelialization.  Wound #15 has a healthy mature eschar.  Wounds 14,  4, 7, and 9 all show improvement with contraction and advancing  epithelium from the periphery.  There is no excessive drainage, malodor,  or local warmth.  There is no evidence of infection.  The pedal pulses  are 3+ and bounding.  The patient remains insensate to the Semmes-  Weinstein filament.   ASSESSMENT:  Clinical improvement of all ulcers of the lower  extremities.   PLAN:  We will encouraged the patient to continue antibacterial soap  washes.  He is to wear cotton socks and offloading shoe inserts.  We  will re-evaluate him in 2 weeks p.r.n.  We have given the patient  opportunity to ask questions.  He seems to understand the importance of  foot protection and local hygiene and indicates that he will be  compliant.      Harold A. Tanda Rockers, M.D.  Electronically Signed     HAN/MEDQ  D:  02/21/2008  T:  02/22/2008  Job:  [id number]
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
## 2011-03-03 NOTE — Discharge Summary (Signed)
NAMEKENDRYCK, LACROIX NO.:  [id number]   MEDICAL RECORD NO.:  [id number]          PATIENT TYPE:  INP   LOCATION:  6735                         FACILITY:  [facility]   PHYSICIAN:  Hettie Holstein, D.O.    DATE OF BIRTH:  [birth year]   DATE OF ADMISSION:  07/28/2007  DATE OF DISCHARGE:  07/30/2007                               DISCHARGE SUMMARY   PRIMARY CARE PHYSICIAN:  [facility].   FINAL DIAGNOSES:  1. Nephrolithiasis with probable passed stone, resolved symptoms with      CT evidence of nephrolithiasis bilaterally and mild left      hydronephrosis without obstruction.  The suspicion was that of a      recently passed left-sided renal calculi.  I have contacted      [facility] and left a message with request that they accept      Mr. Karp for new urology outpatient visit.  I have provided      contact information, left a message with Dr. Francesco Runner on call at      the time of attempted consultation.  2. Degenerative disk disease.  3. Poorly controlled diabetes with a hemoglobin A1c of 10.8.  Extended      discussions have been undertaken with Mr. Viscomi who tends to be in      a great deal of denial.  He states he has been managing fine with      this diabetes for many years.  He does have a primary care      physician, Dr. Lacie Scotts.  His hemoglobin A1c is 10.8, indicating      very poor outpatient control of this patient's diabetes.  4. Elevated D-dimer, status post CT scan of his chest.  No evidence of      pulmonary embolus or dissection was noted.  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   MEDICATIONS ON DISCHARGE:  Mr. Szafranski was unable to recall any of his  medications.  I had contacted [facility] on [address] at [phone], to  verify.  He was recently prescribed  1. Lorcet 10/500 dispense #30 on July 26, 2007.  2. Cipro 500 mg b.i.d. on July 26, 2007.  3. Lantus on July 18, 2007.  4. Aspirin 81 mg daily.  I  instructed Mr. Flater to continue these medications, though I have  requested that he increase his Lantus at 30 units subcu nightly.  I have  discussed with him the addition of meal coverage NovoLog to his diabetes  regimen.  He declines this at this time.  He states that he will work  with his primary care physician to get his diabetes under better  control.  I had him requesting diabetes education.   HOSPITAL COURSE:  As noted above, Mr. Knipple is a 65 year old male who  presented as described in the history and physical by Dr. Wilson Singer, on July 28, 2007.  He had some right upper back pain and  thought he felt as though he  had a kidney stone a week or so previously.  Reported some hematuria.  He was treated for a urinary tract infection  with Cipro.  He presented to the emergency department that showed  bilateral kidney stones and mild left hydronephrosis and a suspicion for  passed renal stone was high.  In any event, he was admitted.  His blood  sugars were out of control.  We placed him on sliding scale in addition  to gentle IV hydration.  Very multiple  vague and migrating complaints.  Mr. Leighty as a strange affect and it is  difficult to elicit a reliable history from Mr. Liebert.  At this juncture  his pain is adequately controlled and it is felt as though he can follow-  up with his primary care physician for optimization of his diabetes in  addition to urology follow-up.      Hettie Holstein, D.O.  Electronically Signed     ESS/MEDQ  D:  07/30/2007  T:  07/31/2007  Job:  [id number]   cc:   Evelene Croon

## 2011-03-03 NOTE — Assessment & Plan Note (Signed)
[facility]   NAMEJUVENAL, Miguel Leblanc                 ACCOUNT NO.:  [id number]   MEDICAL RECORD NO.:  [id number]      DATE OF BIRTH:  1946-01-31   PHYSICIAN:  Theresia Majors. Tanda Rockers, M.D. VISIT DATE:  12/06/2007                                   OFFICE VISIT   SUBJECTIVE:  Mr. Couse is a 65 year old man, who we have followed for  multiple Wagner grade 2 diabetic foot ulcers related to trauma.  In the  interim, we have instructed him to use antiseptic soap washes daily,  continue to wear offloading healing sandals and a white sock as a  dressing.  There has been scant drainage.  There has been no malodor.  The wounds continue to improve.  There has been no fever or redness.   OBJECTIVE:  Blood pressure 105/63, respirations 18, pulse rate 78,  temperature 97.7.  Capillary blood glucose is 325 mg%.  Inspection of  the lower extremity shows no evidence of edema.  The wounds were  numbered 3, 4, 5, 7, 8, 9, 11 and 12.  All wounds uniformly showed an  inherent eschar, with no drainage and spontaneous slough of the eschar  revealing a re-epithelialized surfaces.  There is no ascending  infection.  The pedal pulses remain 3+ and bounding.  The patient  remains insensate.  The offloading healing sandals are adequate.   ASSESSMENT:  Clinical improvement with offloading and local hygiene.   PLAN:  We will continue the offloading healing sandals, the antiseptic  soap daily washes, thorough drying of the feet, and the wearing of a  white cotton sock as a dressing.  We will reevaluate the patient in 2  weeks p.r.n.      Harold A. Tanda Rockers, M.D.  Electronically Signed     HAN/MEDQ  D:  12/06/2007  T:  12/07/2007  Job:  [id number]

## 2011-03-03 NOTE — H&P (Signed)
NAMEBECKER, Miguel                 ACCOUNT NO.:  [id number]   MEDICAL RECORD NO.:  [id number]          PATIENT TYPE:  INP   LOCATION:  1225                         [facility]ACILITY:  [facility]   PHYSICIAN:  Della Goo, M.D. DATE OF BIRTH:  26-Sep-1946   DATE OF ADMISSION:  08/19/2007  DATE OF DISCHARGE:                              HISTORY & PHYSICAL   PRIMARY CARE PHYSICIAN:  This is an unassigned patient.   CHIEF COMPLAINT:  Decreased blood pressure.   HISTORY OF PRESENT ILLNESS:  This is a 65 year old male who was sent  emergently to the hospital for evaluation of low blood pressure and  elevated blood sugars.  The patient and his family have a neighbor who  is a nurse who came to check on the patient and checked his blood  pressure and it was found to low with a systolic of 70, and his blood  sugar was also checked and found to be 579.  She advised them to call  the ambulance and get to the hospital.  The patient reports being sick  over the past 2 weeks, having increased back pain.  Also reports passing  approximately 10 kidney stones at home.  He does report having fevers  and chills.  He also reports having decreased appetite over the past 2  weeks.  The patient does have a history of diabetes, type 2.  The  patient was last hospitalized 2 weeks ago and had adjustment in his  diabetes medications.   PAST MEDICAL HISTORY:  1. Type 2 diabetes.  2. Kidney stones.  3. Hypertension.  4. Hyperlipidemia.  5. Arthritis.   PAST SURGICAL HISTORY:  History of right fourth toe amputation.   ALLERGIES:  NO KNOWN DRUG ALLERGIES.   MEDICATIONS:  1. Avandia 8 mg 1 p.o. q.a.m.  2. Diovan 80/12.5 one p.o. q.a.m.  3. Janumet 50/100 1 p.o. b.i.d.  4. Lantus insulin 30 units subcu q.h.s.  5. Humalog insulin sliding scale coverage p.r.n.  6. Aspirin 325 mg one p.o. daily.   SOCIAL HISTORY:  The patient is married.  He has a tobacco history - he  chews tobacco.  Alcohol history is unknown at  this time per family.   FAMILY HISTORY:  Positive diabetes mellitus in his mother.  Positive  coronary artery disease in his father who also had a cerebrovascular  accident and hypertension.  No history of cancer in his family that they  know of.  The medical history is per the patient and his family as well  as a medical records.   REVIEW OF SYSTEMS:  Pertinents are mentioned above.   PHYSICAL EXAMINATION:  GENERAL:  This is a cachectic and toxic-appearing  65 year old male.  VITAL SIGNS:  Temperature 101.5, blood pressures initially 91/65 to  72/47, heart rate 117-96, and respirations 20, O2 saturation 96-100%.  HEENT:  Normocephalic, atraumatic.  Pupils equally round and reactive to  light.  Extraocular muscles are intact.  Funduscopic benign.  Oropharynx  with dry oral mucosa.  No exudates.  Poor dentition.  NECK:  Supple, full range of motion.  No  thyromegaly, adenopathy, or  jugular venous distention.  CARDIOVASCULAR:  Tachycardiac rate and  rhythm.  No murmurs, gallops, or rubs.  LUNGS:  Clear to auscultation bilaterally.  No rales, rhonchi or  wheezes.  ABDOMEN:  Positive bowel sounds.  Soft, nontender, nondistended.  EXTREMITIES:  Without cyanosis, clubbing, or edema.  NEUROLOGIC:  Generalized weakness.  The patient is mildly confused.  He  is alert and oriented x2.  He is able to move all 4 of his extremities.  There are no focal deficits.   LABORATORY STUDIES:  White blood cell count 9.0, hemoglobin 8.9,  hematocrit 26.7, platelets 399, neutrophils 89%, lymphocytes 5%.  Sodium  126, potassium 5.3, chloride 8.4, carbon dioxide 29, BUN 41, creatinine  2.77, glucose 343.  Urinalysis:  Large leukocyte esterase, trace  ketones, small bilirubin, large urine hemoglobin.  Urine glucose 250  mg/dL.   Chest x-ray findings reveal no active disease process.   ASSESSMENT:  51. 65 year old male being admitted with sepsis, and the site appears      to be the urine.  2.  Hypotension.  3. Hyperglycemia.  4. Acute renal failure with chronic renal insufficiency.  5. Failure to thrive syndrome.  6. History of kidney stones.  7. Hyponatremia.   PLAN:  The patient will be placed on IV fluids for fluid resuscitation.  IV dopamine will be ordered in the event of continued hypotension.  The  patient will be placed on IV antibiotic therapy of Zosyn and Cipro.  Blood cultures and urine cultures have been sent.  Sliding scale insulin  coverage has also been ordered.  His p.o. medications will be on hold  for now, especially his medications which are metformin derivatives.  His electrolytes will be monitored and replaced p.r.n.  The patient will  be placed on DVT and GI prophylaxis.  His Lantus therapy will be written  for half dose since the patient is not taking p.o. adequately right now.  His Lantus insulin will be further adjusted as needed as well.      Della Goo, M.D.  Electronically Signed     HJ/MEDQ  D:  08/20/2007  T:  08/21/2007  Job:  [id number]

## 2011-03-03 NOTE — Consult Note (Signed)
NAMEEZIO, Miguel Leblanc                 ACCOUNT NO.:  [id number]   MEDICAL RECORD NO.:  [id number]          PATIENT TYPE:  INP   LOCATION:  1407                         [facility]AC[facility]  [facility]   PHYSICIAN:  Erasmo Leventhal, M.D.DATE OF BIRTH:  January 11, 1946   DATE OF CONSULTATION:  08/24/2007  DATE OF DISCHARGE:                                 CONSULTATION   HISTORY OF PRESENT ILLNESS:  Mr. Liebler is a 65 year old Caucasian male  who states he fell approximately 10 days ago and landed on his right  elbow and his right hip region.  He did not seem to have much in the way  of problems with that but he later presented to the hospital with  hypotension and hypoglycemia.  He was found to have a Staph aureus  septicemia.  He has been treated for that with antibiotics, etc., and  that is resolving. I have been asked to see the patient for right  forearm and elbow pain and right hip pain.  He has also been noted to  have nephrolithiasis and has actually seen urology for that.  At this  point in time, the patient is awake, alert, only complains of mild right  lateral hip pain.  He states his right elbow feels fine.   PAST MEDICAL HISTORY:  1. Type 2 diabetes.  2. Nephrolithiasis.  3. Hypertension.  4. Hyperlipidemia.  5. Arthritis.   SURGICAL HISTORY:  History of right 4th amputation.   HE HAS NO KNOWN DRUG ALLERGIES.   CURRENT MEDICATIONS IN THE HOSPITAL:  Documented in the chart.   SOCIAL HISTORY:  He is married.  Chews tobacco.  Self-employed.  Other  pertinent history is in the chart at this time.   PHYSICAL EXAMINATION:  Patient is awake, alert, oriented to person,  place, time, and circumstance.  Right upper extremity exam, his hand and  wrist and forearm are unremarkable.  He has a small healed abrasion over  the lateral aspect of the elbow region and he has some swelling and  induration and slight warmth of the olecranon region.  This area was  nontender and nonfluctuant.  The elbow  itself has full range of motion.  No palpable fusion.  He is nontender over the olecranon region.  Strength is normal.  No palpable lymph nodes proximal.   Lower extremities are equal in length.  His right lower extremity is  examined.  He has full range of motion of the right hip, active and  passive.  He does have pain with maximum adduction.  He has tenderness  of the greater trochanter, otherwise unremarkable. Distal neurovascular  examination is intact.   Plain x-rays of the elbow reviewed showed no obvious acute problem.  There was a small olecranon spur with a question recent fracture  through that.  Clinical data __________ the diagnosis.   The hip shows mild degenerative changes, otherwise negative.   He just recently underwent a right hip aspiration.  I talked to the  radiologist.  There was minimal fluid in the joint and no evidence of  pus.   At this  time, I have canceled the right forearm MRI scan.   IMPRESSION:  1. Right lateral hip pain, posttraumatic from fall approximately 10      days ago, probably resolving contusion versus posttraumatic      trochanteric bursitis.  Recommendation will be moist heat as      necessary and physical therapy to assist with ambulation, time, and      observation.  Analgesics of choice.  This should resolve.  2. Right elbow, resolving abrasion, what appears to be an olecranon      bursitis.  There is no evidence of acute fracture or septic joint.      There is also no evidence of abscess.   Overall, I believe the right elbow could have been the source of a Staph  aureus olecranon bursitis which could have been the source of a  septicemia.  At this point in time, there is no fluctuance.  There is no  fluid aspirate.  There is some induration.  I believe with moist heat,  antibiotics, and time it should also resolve.  He has full motion there  without any pain.   Overall, I feel like there is no evidence of abscess, osteomyelitis,  or  septic joint at this point in time.  I would recommend simple  observation for the above-noted areas.  Symptomatic treatment is  recommended and also I would continue antibiotics at this point in time  as recommended per infectious disease.  As far as the elbow goes, it  does not need aspiration or drainage.  Simple observation, he should do  fine.  All questions __________ patient in detail.           ______________________________  Erasmo Leventhal, M.D.     RAC/MEDQ  D:  08/24/2007  T:  08/25/2007  Job:  [id number]   cc:   Erasmo Leventhal, M.D.  Fax: [phone]   Thomasenia Bottoms, MD

## 2011-03-03 NOTE — Discharge Summary (Signed)
NAMEROBB, Miguel Leblanc                 ACCOUNT NO.:  [id number]   MEDICAL RECORD NO.:  [id number]          PATIENT TYPE:  INP   LOCATION:  4728                         FACILITY:  [facility]   PHYSICIAN:  Lonia Blood, M.D.       DATE OF BIRTH:  03/12/1946   DATE OF ADMISSION:  09/28/2007  DATE OF DISCHARGE:  10/04/2007                               DISCHARGE SUMMARY   PRIMARY CARE PHYSICIAN:  Evelene Croon, MD   DISCHARGE DIAGNOSIS:  For complete list of discharge diagnosis, refer to  previously dictated discharge summary done by Dr. Sharon Seller.   DISCHARGE MEDICATIONS:  1. Aspirin 325 mg daily.  2. Janumet 50/1000 twice a day.  3. Doxycycline 100 mg twice a day for 2 weeks.  4. Avandia 80 mg a day.  5. Lantus 10/40 8 units at bedtime.  6. Lipitor 10 mg daily.  7. Cymbalta 60 mg daily.  8. Cipro 500 mg twice a day for 2 weeks.  9. Diovan/hydrochlorothiazide 80/12.5 mg daily.   CONDITION ON DISCHARGE:  Miguel Leblanc was discharged home with Home Health,  physical therapy, occupational therapy, and registered nurse to help  with the dressing changes.  The patient was instructed to follow up at  [facility] on October 10, 2007 at 10 a.m.   CONSULTATIONS, PROCEDURES, HOSPITAL COURSE:  For list of consultations,  procedures, hospital course refer to the previously dictated discharge  summary.  On October 04, 2007, Miguel Leblanc was deemed safe for discharge.  His physical exam was unchanged.  His diabetes was under better control.  His Foley catheter is removed, and the patient has ambulated on the  floor with minimal assistance.      Lonia Blood, M.D.  Electronically Signed     SL/MEDQ  D:  10/04/2007  T:  10/04/2007  Job:  [id number]   cc:   Leonides Grills, M.D.  [facility]  [facility]

## 2011-07-24 LAB — BASIC METABOLIC PANEL
Calcium: 8.5
Creatinine, Ser: 0.68
GFR calc Af Amer: >60
GFR calc non Af Amer: >60
Glucose, Bld: 269 — ABNORMAL HIGH
Sodium: 137

## 2011-07-27 LAB — COMPREHENSIVE METABOLIC PANEL
ALT: 12
ALT: 8
ALT: 9
AST: 15
AST: 16
AST: 22
Albumin: 2.3 — ABNORMAL LOW
BUN: 20
CO2: 28
CO2: 31
Calcium: 8.5
Calcium: 8.6
Calcium: 9.2
Chloride: 106
Chloride: 96
Creatinine, Ser: 0.59
Creatinine, Ser: 0.75
Creatinine, Ser: 0.9
GFR calc Af Amer: >60
GFR calc Af Amer: >60
GFR calc Af Amer: >60
GFR calc Af Amer: >60
GFR calc non Af Amer: >60
GFR calc non Af Amer: >60
Glucose, Bld: 90
Sodium: 132 — ABNORMAL LOW
Sodium: 135
Total Bilirubin: 0.6
Total Bilirubin: 0.9
Total Protein: 5.4 — ABNORMAL LOW
Total Protein: 6.3

## 2011-07-27 LAB — CBC
HCT: 28.9 — ABNORMAL LOW
Hemoglobin: 7.8 — CL
Hemoglobin: 9.6 — ABNORMAL LOW
MCHC: 33
MCHC: 33.3
MCHC: 33.8
MCV: 81
MCV: 81
MCV: 82
MCV: 82.3
Platelets: 204
Platelets: 217
Platelets: 224
RBC: 2.88 — ABNORMAL LOW
RBC: 3.53 — ABNORMAL LOW
RDW: 17.6 — ABNORMAL HIGH
RDW: 17.7 — ABNORMAL HIGH
WBC: 4.8
WBC: 4.9
WBC: 5.6
WBC: 7.7

## 2011-07-27 LAB — HEMOGLOBIN A1C: Hgb A1c MFr Bld: 10.9 — ABNORMAL HIGH

## 2011-07-27 LAB — CARDIAC PANEL(CRET KIN+CKTOT+MB+TROPI)
CK, MB: 3.3
Relative Index: INVALID
Relative Index: INVALID
Total CK: 40
Troponin I: 0.01
Troponin I: 0.01

## 2011-07-27 LAB — URINALYSIS, ROUTINE W REFLEX MICROSCOPIC
Bilirubin Urine: NEGATIVE
Hgb urine dipstick: NEGATIVE
Ketones, ur: NEGATIVE
Nitrite: NEGATIVE
Specific Gravity, Urine: 1.024
Urobilinogen, UA: 1

## 2011-07-27 LAB — CULTURE, BLOOD (ROUTINE X 2): Culture: NO GROWTH

## 2011-07-27 LAB — DIFFERENTIAL
Basophils Absolute: 0
Eosinophils Relative: 1
Lymphocytes Relative: 13
Lymphs Abs: 1
Neutro Abs: 6
Neutrophils Relative %: 78 — ABNORMAL HIGH

## 2011-07-27 LAB — LIPID PANEL: HDL: 39 — ABNORMAL LOW

## 2011-07-27 LAB — URINE MICROSCOPIC-ADD ON

## 2011-07-27 LAB — PROTIME-INR
INR: 1.1
Prothrombin Time: 14.5

## 2011-07-27 LAB — I-STAT 8, (EC8 V) (CONVERTED LAB)
Acid-Base Excess: 4 — ABNORMAL HIGH
Bicarbonate: 29.8 — ABNORMAL HIGH
Potassium: 4.2
TCO2: 31
pH, Ven: 7.385 — ABNORMAL HIGH

## 2011-07-27 LAB — APTT: aPTT: 36

## 2011-07-27 LAB — VANCOMYCIN, TROUGH: Vancomycin Tr: 12.4

## 2011-07-27 LAB — FERRITIN: Ferritin: 233 (ref 22–322)

## 2011-07-27 LAB — B-NATRIURETIC PEPTIDE (CONVERTED LAB): Pro B Natriuretic peptide (BNP): 42

## 2011-07-27 LAB — TSH: TSH: 2.376

## 2011-07-27 LAB — RETICULOCYTES
RBC.: 3.04 — ABNORMAL LOW
Retic Ct Pct: 1.1

## 2011-07-27 LAB — POCT CARDIAC MARKERS
CKMB, poc: 3.2
Operator id: 234501
Troponin i, poc: <0.05

## 2011-07-27 LAB — GLUCOSE, RANDOM: Glucose, Bld: 392 — ABNORMAL HIGH

## 2011-07-27 LAB — METHYLMALONIC ACID(MMA), RND URINE
Creatinine, Urine mg/dL-MMAURN: 10 mg/dL
Methylmalonic Acid, Random Urine: 4.4 mmol/m — ABNORMAL HIGH (ref 0.0–3.6)

## 2011-07-27 LAB — VITAMIN B12: Vitamin B-12: 302 (ref 211–911)

## 2011-07-27 LAB — OCCULT BLOOD X 1 CARD TO LAB, STOOL: Fecal Occult Bld: NEGATIVE

## 2011-07-27 LAB — IRON AND TIBC: UIBC: 163

## 2011-07-28 LAB — DIFFERENTIAL
Basophils Absolute: 0
Basophils Relative: 0
Lymphocytes Relative: 7 — ABNORMAL LOW
Monocytes Absolute: 0.5
Neutro Abs: 6.7
Neutrophils Relative %: 87 — ABNORMAL HIGH

## 2011-07-28 LAB — BASIC METABOLIC PANEL
BUN: 11
BUN: 12
CO2: 29
CO2: 31
CO2: 31
Calcium: 8.2 — ABNORMAL LOW
Calcium: 8.2 — ABNORMAL LOW
Calcium: 8.2 — ABNORMAL LOW
Calcium: 8.5
Chloride: 101
Chloride: 97
Creatinine, Ser: 0.64
Creatinine, Ser: 0.79
Creatinine, Ser: 0.83
Creatinine, Ser: 0.94
Creatinine, Ser: 1.85 — ABNORMAL HIGH
GFR calc Af Amer: >60
GFR calc Af Amer: >60
GFR calc Af Amer: >60
GFR calc non Af Amer: 37 — ABNORMAL LOW
GFR calc non Af Amer: >60
GFR calc non Af Amer: >60
Glucose, Bld: 183 — ABNORMAL HIGH
Glucose, Bld: 232 — ABNORMAL HIGH
Glucose, Bld: 74
Glucose, Bld: 88
Potassium: 3.2 — ABNORMAL LOW
Sodium: 127 — ABNORMAL LOW
Sodium: 137

## 2011-07-28 LAB — CULTURE, BLOOD (ROUTINE X 2)
Culture: NO GROWTH
Culture: NO GROWTH

## 2011-07-28 LAB — COMPREHENSIVE METABOLIC PANEL
ALT: 14
AST: 25
Albumin: 1.4 — ABNORMAL LOW
Alkaline Phosphatase: 204 — ABNORMAL HIGH
Potassium: 3.8
Sodium: 132 — ABNORMAL LOW
Total Protein: 5.4 — ABNORMAL LOW

## 2011-07-28 LAB — CBC
HCT: 26.2 — ABNORMAL LOW
Hemoglobin: 8.9 — ABNORMAL LOW
Hemoglobin: 9.1 — ABNORMAL LOW
Hemoglobin: 9.7 — ABNORMAL LOW
MCHC: 33
MCHC: 33.4
MCHC: 33.7
MCV: 79
MCV: 79.4
Platelets: 322
Platelets: 368
Platelets: 370
Platelets: 417 — ABNORMAL HIGH
RBC: 2.85 — ABNORMAL LOW
RBC: 3.18 — ABNORMAL LOW
RBC: 3.4 — ABNORMAL LOW
RDW: 13.6
RDW: 13.7
RDW: 14.1 — ABNORMAL HIGH
RDW: 14.2 — ABNORMAL HIGH
RDW: 14.4 — ABNORMAL HIGH
RDW: 14.8 — ABNORMAL HIGH
WBC: 5.1

## 2011-07-28 LAB — HEMOGLOBIN AND HEMATOCRIT, BLOOD
HCT: 26.9 — ABNORMAL LOW
Hemoglobin: 9 — ABNORMAL LOW

## 2011-07-28 LAB — GRAM STAIN: Gram Stain: NONE SEEN

## 2011-07-28 LAB — IRON AND TIBC: Iron: <10 — ABNORMAL LOW

## 2011-07-28 LAB — TSH: TSH: 1.65

## 2011-07-28 LAB — RETICULOCYTES
RBC.: 3.51 — ABNORMAL LOW
Retic Count, Absolute: 14 — ABNORMAL LOW

## 2011-07-28 LAB — VITAMIN B12: Vitamin B-12: 280 (ref 211–911)

## 2011-07-29 LAB — CULTURE, BLOOD (ROUTINE X 2)

## 2011-07-29 LAB — URINALYSIS, ROUTINE W REFLEX MICROSCOPIC
Glucose, UA: 250 — AB
Nitrite: NEGATIVE
Specific Gravity, Urine: 1.025
pH: 5

## 2011-07-29 LAB — COMPREHENSIVE METABOLIC PANEL
ALT: 15
AST: 31
Alkaline Phosphatase: 122 — ABNORMAL HIGH
CO2: 29
GFR calc Af Amer: 28 — ABNORMAL LOW
GFR calc non Af Amer: 23 — ABNORMAL LOW
Glucose, Bld: 343 — ABNORMAL HIGH
Potassium: 5.3 — ABNORMAL HIGH
Sodium: 126 — ABNORMAL LOW
Total Protein: 6.8

## 2011-07-29 LAB — OCCULT BLOOD X 1 CARD TO LAB, STOOL: Fecal Occult Bld: NEGATIVE

## 2011-07-29 LAB — CROSSMATCH

## 2011-07-29 LAB — CBC
Hemoglobin: 8.9 — ABNORMAL LOW
RBC: 3.38 — ABNORMAL LOW

## 2011-07-29 LAB — DIFFERENTIAL
Basophils Relative: 0
Eosinophils Absolute: 0
Eosinophils Relative: 0
Monocytes Relative: 6
Neutrophils Relative %: 89 — ABNORMAL HIGH

## 2011-07-29 LAB — SAMPLE TO BLOOD BANK

## 2011-07-29 LAB — URINE CULTURE

## 2011-07-29 LAB — KETONES, QUALITATIVE: Acetone, Bld: NEGATIVE

## 2011-07-29 LAB — URINE MICROSCOPIC-ADD ON

## 2011-07-30 LAB — HEPATIC FUNCTION PANEL
ALT: 12
AST: 22
Albumin: 2.4 — ABNORMAL LOW
Bilirubin, Direct: 0.3

## 2011-07-30 LAB — URINALYSIS, ROUTINE W REFLEX MICROSCOPIC
Glucose, UA: >1000 — AB
Specific Gravity, Urine: 1.027
Urobilinogen, UA: 1

## 2011-07-30 LAB — COMPREHENSIVE METABOLIC PANEL
AST: 21
Albumin: 2.3 — ABNORMAL LOW
Alkaline Phosphatase: 115
Chloride: 89 — ABNORMAL LOW
GFR calc Af Amer: >60
Potassium: 4.4
Sodium: 128 — ABNORMAL LOW
Total Bilirubin: 1.1
Total Protein: 6.7

## 2011-07-30 LAB — CBC
HCT: 32.6 — ABNORMAL LOW
Hemoglobin: 10.8 — ABNORMAL LOW
MCV: 83.8
Platelets: 421 — ABNORMAL HIGH
Platelets: 445 — ABNORMAL HIGH
RDW: 13.2
RDW: 13.8
WBC: 10.7 — ABNORMAL HIGH
WBC: 8.9

## 2011-07-30 LAB — I-STAT 8, (EC8 V) (CONVERTED LAB)
BUN: 22
Bicarbonate: 33.1 — ABNORMAL HIGH
Chloride: 88 — ABNORMAL LOW
Glucose, Bld: 317 — ABNORMAL HIGH
Hemoglobin: 13.3
Potassium: 4
Sodium: 124 — ABNORMAL LOW

## 2011-07-30 LAB — URINE CULTURE

## 2011-07-30 LAB — URINE MICROSCOPIC-ADD ON

## 2011-07-30 LAB — BASIC METABOLIC PANEL
BUN: 13
Chloride: 91 — ABNORMAL LOW
GFR calc non Af Amer: >60
Glucose, Bld: 125 — ABNORMAL HIGH
Potassium: 4.3
Sodium: 131 — ABNORMAL LOW

## 2011-07-30 LAB — POCT I-STAT CREATININE
Creatinine, Ser: 0.9
Operator id: 198171

## 2012-12-17 HISTORY — PX: OTHER SURGICAL HISTORY: SHX169

## 2013-01-11 ENCOUNTER — Encounter: Payer: Self-pay | Admitting: Cardiothoracic Surgery

## 2013-01-11 ENCOUNTER — Encounter: Payer: Self-pay | Admitting: Nurse Practitioner

## 2013-01-17 ENCOUNTER — Encounter: Payer: Self-pay | Admitting: Nurse Practitioner

## 2013-01-17 ENCOUNTER — Encounter: Payer: Self-pay | Admitting: Cardiothoracic Surgery

## 2013-02-16 ENCOUNTER — Encounter: Payer: Self-pay | Admitting: Nurse Practitioner

## 2013-02-16 ENCOUNTER — Encounter: Payer: Self-pay | Admitting: Cardiothoracic Surgery

## 2013-03-19 ENCOUNTER — Encounter: Payer: Self-pay | Admitting: Nurse Practitioner

## 2013-03-19 ENCOUNTER — Encounter: Payer: Self-pay | Admitting: Cardiothoracic Surgery

## 2013-04-18 ENCOUNTER — Encounter: Payer: Self-pay | Admitting: Cardiothoracic Surgery

## 2013-04-18 ENCOUNTER — Encounter: Payer: Self-pay | Admitting: Nurse Practitioner

## 2013-05-19 ENCOUNTER — Encounter: Payer: Self-pay | Admitting: Cardiothoracic Surgery

## 2013-05-19 ENCOUNTER — Ambulatory Visit: Payer: Self-pay | Admitting: Oncology

## 2013-06-06 ENCOUNTER — Ambulatory Visit: Payer: Self-pay | Admitting: Oncology

## 2013-06-06 LAB — CBC CANCER CENTER
Basophil #: 0.1 x10 3/mm (ref 0.0–0.1)
HCT: 20.8 % — ABNORMAL LOW (ref 40.0–52.0)
HGB: 6.6 g/dL — ABNORMAL LOW (ref 13.0–18.0)
MCH: 24.6 pg — ABNORMAL LOW (ref 26.0–34.0)
MCHC: 31.6 g/dL — ABNORMAL LOW (ref 32.0–36.0)
MCV: 78 fL — ABNORMAL LOW (ref 80–100)
RBC: 2.67 10*6/uL — ABNORMAL LOW (ref 4.40–5.90)
WBC: 10.6 x10 3/mm (ref 3.8–10.6)

## 2013-06-06 LAB — IRON AND TIBC
Iron Bind.Cap.(Total): 210 ug/dL — ABNORMAL LOW (ref 250–450)
Iron Saturation: 12 %
Iron: 25 ug/dL — ABNORMAL LOW (ref 65–175)
Unbound Iron-Bind.Cap.: 185 ug/dL

## 2013-06-06 LAB — LACTATE DEHYDROGENASE: LDH: 172 U/L (ref 85–241)

## 2013-06-06 LAB — FOLATE: Folic Acid: 8.2 ng/mL (ref 3.1–100.0)

## 2013-06-06 LAB — RETICULOCYTES
Absolute Retic Count: 0.0625 10*6/uL
Reticulocyte: 2.34 %

## 2013-06-06 LAB — FERRITIN: Ferritin (ARMC): 262 ng/mL (ref 8–388)

## 2013-06-19 ENCOUNTER — Ambulatory Visit: Payer: Self-pay | Admitting: Oncology

## 2013-06-19 ENCOUNTER — Encounter: Payer: Self-pay | Admitting: Cardiothoracic Surgery

## 2013-06-20 LAB — CBC CANCER CENTER
Basophil %: 1.2 %
Eosinophil #: 0.2 x10 3/mm (ref 0.0–0.7)
HCT: 25.9 % — ABNORMAL LOW (ref 40.0–52.0)
HGB: 8.2 g/dL — ABNORMAL LOW (ref 13.0–18.0)
Lymphocyte #: 0.9 x10 3/mm — ABNORMAL LOW (ref 1.0–3.6)
Lymphocyte %: 12.7 %
MCH: 25.1 pg — ABNORMAL LOW (ref 26.0–34.0)
MCHC: 31.7 g/dL — ABNORMAL LOW (ref 32.0–36.0)
Monocyte %: 5.7 %
Neutrophil %: 78 %
Platelet: 439 x10 3/mm (ref 150–440)
RBC: 3.27 10*6/uL — ABNORMAL LOW (ref 4.40–5.90)
RDW: 16.4 % — ABNORMAL HIGH (ref 11.5–14.5)
WBC: 7.1 x10 3/mm (ref 3.8–10.6)

## 2013-06-25 ENCOUNTER — Emergency Department (HOSPITAL_COMMUNITY): Payer: Managed Care, Other (non HMO)

## 2013-06-25 ENCOUNTER — Inpatient Hospital Stay (HOSPITAL_COMMUNITY)
Admission: EM | Admit: 2013-06-25 | Discharge: 2013-07-04 | DRG: 871 | Disposition: A | Discharge status: Skilled Nursing Facility | Payer: Managed Care, Other (non HMO) | Attending: Family Medicine | Admitting: Family Medicine

## 2013-06-25 ENCOUNTER — Inpatient Hospital Stay (HOSPITAL_COMMUNITY): Payer: Managed Care, Other (non HMO)

## 2013-06-25 ENCOUNTER — Encounter (HOSPITAL_COMMUNITY): Payer: Self-pay | Admitting: Emergency Medicine

## 2013-06-25 DIAGNOSIS — I10 Essential (primary) hypertension: Secondary | ICD-10-CM

## 2013-06-25 DIAGNOSIS — E46 Unspecified protein-calorie malnutrition: Secondary | ICD-10-CM | POA: Diagnosis present

## 2013-06-25 DIAGNOSIS — D649 Anemia, unspecified: Secondary | ICD-10-CM

## 2013-06-25 DIAGNOSIS — M5137 Other intervertebral disc degeneration, lumbosacral region: Secondary | ICD-10-CM | POA: Diagnosis present

## 2013-06-25 DIAGNOSIS — L97309 Non-pressure chronic ulcer of unspecified ankle with unspecified severity: Secondary | ICD-10-CM | POA: Diagnosis present

## 2013-06-25 DIAGNOSIS — D1809 Hemangioma of other sites: Secondary | ICD-10-CM | POA: Diagnosis present

## 2013-06-25 DIAGNOSIS — Z79899 Other long term (current) drug therapy: Secondary | ICD-10-CM

## 2013-06-25 DIAGNOSIS — E871 Hypo-osmolality and hyponatremia: Secondary | ICD-10-CM | POA: Diagnosis present

## 2013-06-25 DIAGNOSIS — A419 Sepsis, unspecified organism: Principal | ICD-10-CM | POA: Diagnosis present

## 2013-06-25 DIAGNOSIS — D638 Anemia in other chronic diseases classified elsewhere: Secondary | ICD-10-CM | POA: Diagnosis present

## 2013-06-25 DIAGNOSIS — E8809 Other disorders of plasma-protein metabolism, not elsewhere classified: Secondary | ICD-10-CM | POA: Diagnosis present

## 2013-06-25 DIAGNOSIS — N135 Crossing vessel and stricture of ureter without hydronephrosis: Secondary | ICD-10-CM

## 2013-06-25 DIAGNOSIS — M51379 Other intervertebral disc degeneration, lumbosacral region without mention of lumbar back pain or lower extremity pain: Secondary | ICD-10-CM | POA: Diagnosis present

## 2013-06-25 DIAGNOSIS — N179 Acute kidney failure, unspecified: Secondary | ICD-10-CM

## 2013-06-25 DIAGNOSIS — E1142 Type 2 diabetes mellitus with diabetic polyneuropathy: Secondary | ICD-10-CM | POA: Diagnosis present

## 2013-06-25 DIAGNOSIS — IMO0002 Reserved for concepts with insufficient information to code with codable children: Secondary | ICD-10-CM | POA: Diagnosis present

## 2013-06-25 DIAGNOSIS — N390 Urinary tract infection, site not specified: Secondary | ICD-10-CM

## 2013-06-25 DIAGNOSIS — E1149 Type 2 diabetes mellitus with other diabetic neurological complication: Secondary | ICD-10-CM | POA: Diagnosis present

## 2013-06-25 DIAGNOSIS — N201 Calculus of ureter: Secondary | ICD-10-CM | POA: Diagnosis present

## 2013-06-25 DIAGNOSIS — N12 Tubulo-interstitial nephritis, not specified as acute or chronic: Secondary | ICD-10-CM | POA: Diagnosis present

## 2013-06-25 DIAGNOSIS — E1310 Other specified diabetes mellitus with ketoacidosis without coma: Secondary | ICD-10-CM | POA: Diagnosis present

## 2013-06-25 DIAGNOSIS — A4901 Methicillin susceptible Staphylococcus aureus infection, unspecified site: Secondary | ICD-10-CM | POA: Diagnosis present

## 2013-06-25 DIAGNOSIS — E1165 Type 2 diabetes mellitus with hyperglycemia: Secondary | ICD-10-CM | POA: Diagnosis present

## 2013-06-25 DIAGNOSIS — Z794 Long term (current) use of insulin: Secondary | ICD-10-CM

## 2013-06-25 DIAGNOSIS — E1110 Type 2 diabetes mellitus with ketoacidosis without coma: Secondary | ICD-10-CM

## 2013-06-25 HISTORY — DX: Essential (primary) hypertension: I10

## 2013-06-25 LAB — URINE MICROSCOPIC-ADD ON

## 2013-06-25 LAB — CBC WITH DIFFERENTIAL/PLATELET
Basophils Absolute: 0 10*3/uL (ref 0.0–0.1)
Basophils Relative: 0 % (ref 0–1)
Eosinophils Absolute: 0.1 10*3/uL (ref 0.0–0.7)
Eosinophils Relative: 1 % (ref 0–5)
HCT: 22.4 % — ABNORMAL LOW (ref 39.0–52.0)
Hemoglobin: 7 g/dL — ABNORMAL LOW (ref 13.0–17.0)
MCH: 25 pg — ABNORMAL LOW (ref 26.0–34.0)
MCHC: 31.3 g/dL (ref 30.0–36.0)
Monocytes Absolute: 0.7 10*3/uL (ref 0.1–1.0)
Monocytes Relative: 7 % (ref 3–12)
Neutro Abs: 9.1 10*3/uL — ABNORMAL HIGH (ref 1.7–7.7)
RDW: 15.1 % (ref 11.5–15.5)

## 2013-06-25 LAB — RETICULOCYTES
RBC.: 3.31 MIL/uL — ABNORMAL LOW (ref 4.22–5.81)
Retic Count, Absolute: 46.3 10*3/uL (ref 19.0–186.0)
Retic Ct Pct: 1.4 % (ref 0.4–3.1)

## 2013-06-25 LAB — CBC
MCHC: 31.7 g/dL (ref 30.0–36.0)
Platelets: 394 10*3/uL (ref 150–400)
RDW: 15.4 % (ref 11.5–15.5)
WBC: 10.6 10*3/uL — ABNORMAL HIGH (ref 4.0–10.5)

## 2013-06-25 LAB — ABO/RH: ABO/RH(D): B POS

## 2013-06-25 LAB — URINALYSIS, ROUTINE W REFLEX MICROSCOPIC
Glucose, UA: NEGATIVE mg/dL
Protein, ur: 100 mg/dL — AB
Specific Gravity, Urine: 1.012 (ref 1.005–1.030)
pH: 6.5 (ref 5.0–8.0)

## 2013-06-25 LAB — COMPREHENSIVE METABOLIC PANEL
AST: 12 U/L (ref 0–37)
Albumin: 1.9 g/dL — ABNORMAL LOW (ref 3.5–5.2)
BUN: 39 mg/dL — ABNORMAL HIGH (ref 6–23)
Calcium: 8.7 mg/dL (ref 8.4–10.5)
Creatinine, Ser: 1.64 mg/dL — ABNORMAL HIGH (ref 0.50–1.35)
Total Bilirubin: 0.1 mg/dL — ABNORMAL LOW (ref 0.3–1.2)
Total Protein: 6.9 g/dL (ref 6.0–8.3)

## 2013-06-25 LAB — CG4 I-STAT (LACTIC ACID): Lactic Acid, Venous: 2.58 mmol/L — ABNORMAL HIGH (ref 0.5–2.2)

## 2013-06-25 LAB — OCCULT BLOOD, POC DEVICE: Fecal Occult Bld: NEGATIVE

## 2013-06-25 LAB — CREATININE, SERUM
GFR calc Af Amer: 62 mL/min — ABNORMAL LOW (ref >90)
GFR calc non Af Amer: 53 mL/min — ABNORMAL LOW (ref >90)

## 2013-06-25 MED ORDER — VANCOMYCIN HCL IN DEXTROSE 750-5 MG/150ML-% IV SOLN
750.0000 mg | INTRAVENOUS | Status: DC
  Administered 2013-06-25: 750 mg via INTRAVENOUS
  Filled 2013-06-25 (×4): qty 150

## 2013-06-25 MED ORDER — ACETAMINOPHEN 325 MG PO TABS
650.0000 mg | ORAL_TABLET | ORAL | Status: DC | PRN
  Administered 2013-06-29: 650 mg via ORAL
  Filled 2013-06-25: qty 2

## 2013-06-25 MED ORDER — INSULIN ASPART 100 UNIT/ML ~~LOC~~ SOLN
0.0000 [IU] | Freq: Three times a day (TID) | SUBCUTANEOUS | Status: DC
  Administered 2013-06-26: 1 [IU] via SUBCUTANEOUS
  Administered 2013-06-26: 7 [IU] via SUBCUTANEOUS
  Administered 2013-06-27: 5 [IU] via SUBCUTANEOUS
  Administered 2013-06-27: 2 [IU] via SUBCUTANEOUS
  Administered 2013-06-28 (×2): 3 [IU] via SUBCUTANEOUS
  Administered 2013-06-28: 12:00:00 5 [IU] via SUBCUTANEOUS
  Administered 2013-06-29: 2 [IU] via SUBCUTANEOUS
  Administered 2013-06-29 (×2): 5 [IU] via SUBCUTANEOUS
  Administered 2013-06-30: 3 [IU] via SUBCUTANEOUS
  Administered 2013-06-30: 5 [IU] via SUBCUTANEOUS
  Administered 2013-06-30 – 2013-07-01 (×3): 7 [IU] via SUBCUTANEOUS
  Administered 2013-07-01: 9 [IU] via SUBCUTANEOUS
  Administered 2013-07-02: 3 [IU] via SUBCUTANEOUS
  Administered 2013-07-02: 17:00:00 7 [IU] via SUBCUTANEOUS
  Administered 2013-07-02: 13:00:00 9 [IU] via SUBCUTANEOUS
  Administered 2013-07-03: 1 [IU] via SUBCUTANEOUS
  Administered 2013-07-03: 13:00:00 3 [IU] via SUBCUTANEOUS
  Administered 2013-07-03: 18:00:00 15 [IU] via SUBCUTANEOUS
  Administered 2013-07-04: 09:00:00 1 [IU] via SUBCUTANEOUS
  Administered 2013-07-04 (×2): 2 [IU] via SUBCUTANEOUS

## 2013-06-25 MED ORDER — DEXTROSE 5 % IV SOLN
1.0000 g | INTRAVENOUS | Status: DC
  Filled 2013-06-25: qty 10

## 2013-06-25 MED ORDER — MORPHINE SULFATE 2 MG/ML IJ SOLN
1.0000 mg | INTRAMUSCULAR | Status: DC | PRN
  Administered 2013-06-25 – 2013-07-04 (×18): 1 mg via INTRAVENOUS
  Filled 2013-06-25 (×19): qty 1

## 2013-06-25 MED ORDER — HYDROCODONE-ACETAMINOPHEN 5-325 MG PO TABS
1.0000 | ORAL_TABLET | ORAL | Status: DC | PRN
  Administered 2013-06-25: 2 via ORAL
  Administered 2013-06-26 (×3): 1 via ORAL
  Administered 2013-06-26 – 2013-06-27 (×3): 2 via ORAL
  Administered 2013-06-27 – 2013-06-28 (×2): 1 via ORAL
  Administered 2013-06-28: 2 via ORAL
  Administered 2013-06-28 (×2): 1 via ORAL
  Administered 2013-06-29 – 2013-06-30 (×3): 2 via ORAL
  Administered 2013-07-01 (×2): 1 via ORAL
  Administered 2013-07-02 – 2013-07-04 (×6): 2 via ORAL
  Filled 2013-06-25 (×3): qty 2
  Filled 2013-06-25 (×5): qty 1
  Filled 2013-06-25 (×2): qty 2
  Filled 2013-06-25: qty 1
  Filled 2013-06-25 (×2): qty 2
  Filled 2013-06-25: qty 1
  Filled 2013-06-25: qty 2
  Filled 2013-06-25 (×2): qty 1
  Filled 2013-06-25 (×4): qty 2
  Filled 2013-06-25 (×2): qty 1
  Filled 2013-06-25: qty 2
  Filled 2013-06-25: qty 1

## 2013-06-25 MED ORDER — VANCOMYCIN HCL IN DEXTROSE 750-5 MG/150ML-% IV SOLN: 750.0000 mg | INTRAVENOUS | Status: DC

## 2013-06-25 MED ORDER — DEXTROSE 5 % IV SOLN
1.0000 g | INTRAVENOUS | Status: DC
  Administered 2013-06-26 – 2013-06-27 (×2): 1 g via INTRAVENOUS
  Filled 2013-06-25 (×2): qty 10

## 2013-06-25 MED ORDER — VANCOMYCIN HCL IN DEXTROSE 1-5 GM/200ML-% IV SOLN: 1000.0000 mg | Freq: Once | INTRAVENOUS | Status: DC

## 2013-06-25 MED ORDER — DEXTROSE 5 % IV SOLN
1.0000 g | Freq: Once | INTRAVENOUS | Status: AC
  Administered 2013-06-25: 1 g via INTRAVENOUS
  Filled 2013-06-25: qty 10

## 2013-06-25 MED ORDER — ONDANSETRON HCL 4 MG/2ML IJ SOLN: 4.0000 mg | Freq: Four times a day (QID) | INTRAMUSCULAR | Status: DC | PRN

## 2013-06-25 MED ORDER — INSULIN ASPART 100 UNIT/ML ~~LOC~~ SOLN
0.0000 [IU] | Freq: Every day | SUBCUTANEOUS | Status: DC
  Administered 2013-06-26: 3 [IU] via SUBCUTANEOUS
  Administered 2013-06-27: 5 [IU] via SUBCUTANEOUS
  Administered 2013-06-28: 23:00:00 2 [IU] via SUBCUTANEOUS
  Administered 2013-06-30: 5 [IU] via SUBCUTANEOUS
  Administered 2013-07-01: 23:00:00 3 [IU] via SUBCUTANEOUS
  Administered 2013-07-04: 5 [IU] via SUBCUTANEOUS

## 2013-06-25 MED ORDER — SODIUM CHLORIDE 0.9 % IV BOLUS (SEPSIS)
1000.0000 mL | Freq: Once | INTRAVENOUS | Status: AC
  Administered 2013-06-25: 1000 mL via INTRAVENOUS

## 2013-06-25 MED ORDER — SODIUM CHLORIDE 0.9 % IJ SOLN
3.0000 mL | Freq: Two times a day (BID) | INTRAMUSCULAR | Status: DC
  Administered 2013-06-25 – 2013-07-04 (×8): 3 mL via INTRAVENOUS

## 2013-06-25 MED ORDER — SODIUM CHLORIDE 0.9 % IV SOLN
INTRAVENOUS | Status: DC
  Administered 2013-06-25 – 2013-06-26 (×3): via INTRAVENOUS
  Administered 2013-06-26: 125 mL/h via INTRAVENOUS
  Administered 2013-06-26 – 2013-06-29 (×5): via INTRAVENOUS
  Administered 2013-06-29: 100 mL/h via INTRAVENOUS
  Administered 2013-06-30 – 2013-07-04 (×8): via INTRAVENOUS

## 2013-06-25 MED ORDER — ONDANSETRON HCL 4 MG PO TABS: 4.0000 mg | ORAL_TABLET | Freq: Four times a day (QID) | ORAL | Status: DC | PRN

## 2013-06-25 MED ORDER — INSULIN GLARGINE 100 UNIT/ML ~~LOC~~ SOLN
10.0000 [IU] | Freq: Every day | SUBCUTANEOUS | Status: DC
  Filled 2013-06-25: qty 0.1

## 2013-06-25 MED ORDER — SIMVASTATIN 20 MG PO TABS
20.0000 mg | ORAL_TABLET | Freq: Every day | ORAL | Status: DC
  Administered 2013-06-25 – 2013-07-04 (×10): 20 mg via ORAL
  Filled 2013-06-25 (×11): qty 1

## 2013-06-25 MED ORDER — ENOXAPARIN SODIUM 30 MG/0.3ML ~~LOC~~ SOLN
30.0000 mg | SUBCUTANEOUS | Status: DC
  Administered 2013-06-25 – 2013-06-26 (×2): 30 mg via SUBCUTANEOUS
  Filled 2013-06-25 (×3): qty 0.3

## 2013-06-25 MED ORDER — MUPIROCIN 2 % EX OINT
1.0000 "application " | TOPICAL_OINTMENT | Freq: Two times a day (BID) | CUTANEOUS | Status: AC
  Administered 2013-06-25 – 2013-06-29 (×10): 1 via NASAL
  Filled 2013-06-25 (×2): qty 22

## 2013-06-25 MED ORDER — CHLORHEXIDINE GLUCONATE CLOTH 2 % EX PADS
6.0000 | MEDICATED_PAD | Freq: Every day | CUTANEOUS | Status: AC
  Administered 2013-06-26 – 2013-06-30 (×5): 6 via TOPICAL

## 2013-06-25 NOTE — ED Notes (Signed)
Pt. Ready to be transported to unit, Dr. In the room doing his assessment.

## 2013-06-25 NOTE — ED Notes (Signed)
Patient states that he has been seeing "little black spots" in his urine.  He states that he has been having some incontinence issues with urgency and more frequently.  Patient denies any nausea or vomiting.

## 2013-06-25 NOTE — ED Notes (Signed)
Dr. Annabell Howells continues to be with the pt. Called unit to report.

## 2013-06-25 NOTE — H&P (Addendum)
[facility] History and Physical  JERYN CERNEY ZOX:[id number] DOB: [birth year] DOA: 06/25/2013  Referring physician: Dr Micheline Maze PCP: Pcp Not In System  Specialists: Dr Wilson Singer.   Chief Complaint: Lower back pain.   HPI: Miguel Leblanc is a 67 y.o. male with PMH significant for Diabetes, HTN who presents to ED complaining of worsening lower back pain. Back pain started day prior to admission, 9/10 in intensity, constant. He has been passing some black small stone in his urine for last 3 weeks, but no pain. He denies fever, chill, diarrhea, abdominal pain. He does relates dysuria.  In the ED he was found to be hypotensive SBP at 87, lactic acid mild elevated at 2.5, CT abdomen/pelvis show Punctate sized stone in the distal left ureter with moderate proximal obstruction. There is diffuse bladder wall thickening  which may represent cystitis. Periureteral and pararenal stranding on the left is probably due to obstruction but pyelonephritis is not excluded. HB at 7. He received IV fluids and IV antibiotics.   Review of Systems:  Negative except as per HPI.   Past Medical History  Diagnosis Date  . Diabetes mellitus without complication   . Hypertension   . Kidney stones    History reviewed. No pertinent past surgical history. Social History:  reports that he has never smoked. He does not have any smokeless tobacco history on file. He reports that he does not drink alcohol or use illicit drugs.   No Known Allergies  Family History: no history of kidney stone.   Prior to Admission medications   Medication Sig Start Date End Date Taking? Authorizing Provider  acetaminophen (TYLENOL) 500 MG tablet Take 1,000 mg by mouth every 4 (four) hours as needed for pain.   Yes Historical Provider, MD  chlorthalidone (HYGROTON) 25 MG tablet Take 25 mg by mouth daily.   Yes Historical Provider, MD  glimepiride (AMARYL) 4 MG tablet Take 4 mg by mouth daily before breakfast.   Yes Historical Provider, MD   ibuprofen (ADVIL,MOTRIN) 200 MG tablet Take 200 mg by mouth daily as needed for pain.   Yes Historical Provider, MD  insulin aspart (NOVOLOG FLEXPEN) 100 UNIT/ML SOPN FlexPen Inject 6-10 Units into the skin 3 (three) times daily with meals. Based on sugar levels   Yes Historical Provider, MD  Insulin Glargine (LANTUS SOLOSTAR) 100 UNIT/ML SOPN Inject 20-30 Units into the skin at bedtime.   Yes Historical Provider, MD  lisinopril (PRINIVIL,ZESTRIL) 5 MG tablet Take 5 mg by mouth daily.   Yes Historical Provider, MD  PRESCRIPTION MEDICATION Place 1 drop into the right eye 4 (four) times daily. Eye drops from [facility]   Yes Historical Provider, MD  simvastatin (ZOCOR) 20 MG tablet Take 20 mg by mouth daily.   Yes Historical Provider, MD   Physical Exam: Filed Vitals:   06/25/13 0700  BP: 134/77  Pulse: 77  Temp:   Resp: 11   General Appearance:    Alert, cooperative, no distress, cachetic, ill appearing.   Head:    Normocephalic, without obvious abnormality, atraumatic  Eyes:    PERRL, conjunctiva/corneas clear, EOM's intact,          Ears:    Normal TM's and external ear canals, both ears  Nose:   Nares normal, septum midline, mucosa normal, no drainage    or sinus tenderness  Throat:   Lips, mucosa, and tongue normal; teeth and gums normal  Neck:   Supple, symmetrical, trachea midline, no adenopathy;  thyroid:  No enlargement/tenderness/nodules; no carotid   bruit or JVD  Back:     Symmetric, no curvature, ROM normal, positive  CVA tenderness  Lungs:     Clear to auscultation bilaterally, respirations unlabored  Chest wall:    No tenderness or deformity  Heart:    Regular rate and rhythm, S1 and S2 normal, no murmur, rub   or gallop  Abdomen:     Soft, non-tender, bowel sounds active all four quadrants,    no masses, no organomegaly        Extremities:   Left Extremity normal, atraumatic, no cyanosis or edema, right LE with chronic wound, anterior aspect tibia with no  drainage.   Pulses:   2+ and symmetric all extremities  Skin:   Skin color, texture, turgor normal, no rashes or lesions  Lymph nodes:   Cervical, supraclavicular, and axillary nodes normal  Neurologic:   CNII-XII intact. Normal strength, sensation and reflexes      throughout      Labs on Admission:  Basic Metabolic Panel:  Recent Labs Lab 06/25/13 0437  NA 133*  K 4.3  CL 98  CO2 24  GLUCOSE 127*  BUN 39*  CREATININE 1.64*  CALCIUM 8.7   Liver Function Tests:  Recent Labs Lab 06/25/13 0437  AST 12  ALT 6  ALKPHOS 105  BILITOT 0.1*  PROT 6.9  ALBUMIN 1.9*   No results found for this basename: LIPASE, AMYLASE,  in the last 168 hours No results found for this basename: AMMONIA,  in the last 168 hours CBC:  Recent Labs Lab 06/25/13 0437  WBC 10.5  NEUTROABS 9.1*  HGB 7.0*  HCT 22.4*  MCV 80.0  PLT 305   Cardiac Enzymes: No results found for this basename: CKTOTAL, CKMB, CKMBINDEX, TROPONINI,  in the last 168 hours  BNP (last 3 results) No results found for this basename: PROBNP,  in the last 8760 hours CBG: No results found for this basename: GLUCAP,  in the last 168 hours  Radiological Exams on Admission: Ct Abdomen Pelvis Wo Contrast  06/25/2013   *RADIOLOGY REPORT*  Clinical Data: Left flank pain starting 2 weeks ago.  Known kidney stones.  CT ABDOMEN AND PELVIS WITHOUT CONTRAST  Technique:  Multidetector CT imaging of the abdomen and pelvis was performed following the standard protocol without intravenous contrast.  Comparison: 08/20/2007  Findings: Small focal areas of tree in bud infiltrates in the right lung base may represent alveolitis.  Healing fracture of the right lower anterior rib.  Coronary artery calcifications.  Punctate sized nonobstructing intrarenal stones bilaterally.  There is left-sided pyelocaliectasis and ureterectasis with left pararenal and periureteral stranding.  There is a punctate sized stone in the distal left ureter just above  the ureterovesicle junction.  No right-sided pyelocaliectasis or ureterectasis.  No right ureteral or bladder stones.  The bladder wall is thickened suggesting cystitis.  Left pyelonephritis is not excluded.  The unenhanced appearance of the liver, spleen, gallbladder, pancreas, adrenal glands, abdominal aorta, and inferior vena cava are unremarkable except for vascular calcifications.  The stomach and small bowel are decompressed.  Stool filled colon without distension.  No free air or free fluid in the abdomen.  Pelvis:  Rectosigmoid colon is contrast filled.  No evidence of diverticulitis.  No free or loculated pelvic fluid collections. Mild enlargement of the prostate gland.  Appendix is not identified.  Degenerative changes in the lumbar spine and hips.  No destructive bone lesions appreciated.  IMPRESSION:  Punctate sized stone in the distal left ureter with moderate proximal obstruction.  There is diffuse bladder wall thickening which may represent cystitis.  Periureteral and pararenal stranding on the left is probably due to obstruction but pyelonephritis is not excluded.  Bilateral nonobstructing intrarenal stones.   Original Report Authenticated By: Burman Nieves, M.D.    EKG: Independently reviewed. Sinus tachycardia.   Assessment/Plan Principal Problem:   Sepsis Active Problems:   UTI (lower urinary tract infection)   Ureteral obstruction, left   Acute renal failure  1-Sepsis: patient presents with hypotension, mild lactic acidosis, Tachycardia. Source of infection Pyelonephritis. UA with 21 to 50 WBC. Will admit to step down unit, IV fluids, IV antibiotics, blood culture, repeat lactic acid in am.   2-Stone in the distal left ureter with moderate proximal obstruction// Pyelonephritis: Continue with IV fluids, IV ceftriaxone, follow up urine culture. Urology Dr Wilson Singer consulted. Pain management with morphine.   3-Acute renal failure.; Cr peak to 1.6. Likely obstructive uropathy secondary  to ureter  stone. Continue with IV fluids, IV antibiotics, Urology consulted. Follow daily renal function.  4-Diabetes: Continue with lantus decrease dose, patient NPO at this time anticipating any procedure.  SSI.  5-Anemia: no evidence of bleeding. He will need further evaluation. He will received one unit PRBC. Will order anemia panel.  6-Mild Hyponatremia: in setting of dehydration, renal failure. Continue with IV fluids.   Addendum:  Chronic Right LE ulcer: no significant drainage, wound care consult. Will start vancomycin in setting of sepsis to cover all possible source.   Code Status: presume Full Code.  Family Communication: Care discussed with patient.  Disposition Plan: expect 3 to 5 days inpatient.   Time spent: 75 minutes.   Yanky Vanderburg [facility] Pager [phone]  If 7PM-7AM, please contact night-coverage [email or web address] Password [email or web address] 06/25/2013, 8:00 AM

## 2013-06-25 NOTE — ED Notes (Signed)
Patient states that the pain is returning at this time.

## 2013-06-25 NOTE — ED Notes (Signed)
Vancomycin sent to [facility]

## 2013-06-25 NOTE — ED Notes (Signed)
Pt. Was rescheduled for the Rocephin, given at 0600.

## 2013-06-25 NOTE — ED Notes (Signed)
Patient with left flank pain, started two weeks ago.  Patient has been diagnosed with known kidney stones and has not seen a physician.  Patient is CAOx3, patient was given 150 mcg of Fentanyl total en route to ED.  Patient denies any nausea or vomiting, diarrhea.

## 2013-06-25 NOTE — ED Notes (Signed)
Patient returned from CT

## 2013-06-25 NOTE — ED Notes (Signed)
Report given to nurse 2C, bed 12

## 2013-06-25 NOTE — Consult Note (Signed)
Subjective: I was asked to see Miguel Leblanc in consultation by Dr. Sunnie Nielsen for a possible stone.   He had the onset 3 weeks ago of urgency with UUI.   He then passed some small black specks.  He counted about 14.  He thought he was doing better.  Yesterday morning he woke and couldn't get out of bed or walk to the bathroom because of low back pain.   He still has pain now.  He took some tylenol which helped.  He laid about on the couch all day and the pain increased and he was taken to the ER.  He wasn't having fever.  He had no nausea.   He had no hematuria.   He didn't have left flank pain and the pain was below his belt line and severe.  He has no dysuria.   His UA today looks infected and he had a CT that suggests a punctate left distal stone with some hydro, but on a CT in 2008 he had mild ureterectesis without a stone.  He does have bilateral renal stones and he has some bladder wall thickening as well.  On admission he was hypotensive with a low grade fever, anemia and ARI with a Ct of 1.64.  He had a similar episode in 2008 and was to be seen in our office but never came.   He has been seen at [facility] for anemia a couple of times in the last month and was told he didn't have cancer and gave him a transfusion and iron.  ROS: Negative except as above and the complaint of numbness in his feet on a 12 point review.  Past Medical History  Diagnosis Date  . Diabetes mellitus without complication   . Hypertension   . 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  No Known Allergies History   Social History  . Marital Status: Married    Spouse Name: N/A    Number of Children: N/A  . Years of Education: N/A   Occupational History  . Not on file.   Social History Main Topics  . Smoking status: Never Smoker   . Smokeless tobacco: Not on file  . Alcohol Use: No  . Drug Use: No  . Sexual Activity: Not on file   Other Topics Concern  . Not on file   Social History  Narrative  . No narrative on file   History reviewed. No pertinent family history.    Objective: Vital signs in last 24 hours: Temp:  [99.9 F (37.7 C)] 99.9 F (37.7 C) (09/07 0238) Pulse Rate:  [77-103] 77 (09/07 0700) Resp:  [11-20] 11 (09/07 0700) BP: (87-134)/(57-79) 134/77 mmHg (09/07 0700) SpO2:  [85 %-100 %] 94 % (09/07 0712) Weight:  [49.896 kg (110 lb)] 49.896 kg (110 lb) (09/07 0712)  Intake/Output from previous day: 09/06 0701 - 09/07 0700 In: 1500 [I.V.:1500] Out: 150 [Urine:150] Intake/Output this shift: Total I/O In: 999 [I.V.:999] Out: 550 [Urine:550]  General appearance: alert, mild distress and complaining of pain Head: Normocephalic, without obvious abnormality, atraumatic Neck: no adenopathy, no carotid bruit, no JVD, supple, symmetrical, trachea midline and thyroid not enlarged, symmetric, no tenderness/mass/nodules Back: symmetric, no curvature. ROM normal. No CVA tenderness., but he has tenderness over the left sacroiliac area Resp: clear to auscultation bilaterally Cardio: regular rate and rhythm GI: soft, non-tender; bowel sounds normal; no masses,  no organomegaly Male genitalia: penis: no  lesions or discharge. testes: no masses or tenderness. no hernias, He has reduced anal tone.   The prostate is not well defined but it hard with a 1cm central nodule Extremities: extremities normal, atraumatic, no cyanosis or edema and he is missing the right two lateral toes and he has pain with straightly leg raising to about 30 degrees bilaterally Skin: Skin color, texture, turgor normal. No rashes or lesions Lymph nodes: No Supraclavicular or inguinal nodes noted Neurologic: Mental status: Alert, oriented, thought content appropriate Motor: grossly normal Psych: No anxiety or depression.  Lab Results:   Recent Labs  06/25/13 0437  WBC 10.5  HGB 7.0*  HCT 22.4*  PLT 305   BMET  Recent Labs  06/25/13 0437  NA 133*  K 4.3  CL 98  CO2 24   GLUCOSE 127*  BUN 39*  CREATININE 1.64*  CALCIUM 8.7   PT/INR No results found for this basename: LABPROT, INR,  in the last 72 hours ABG No results found for this basename: PHART, PCO2, PO2, HCO3,  in the last 72 hours  Studies/Results: Ct Abdomen Pelvis Wo Contrast  06/25/2013   *RADIOLOGY REPORT*  Clinical Data: Left flank pain starting 2 weeks ago.  Known kidney stones.  CT ABDOMEN AND PELVIS WITHOUT CONTRAST  Technique:  Multidetector CT imaging of the abdomen and pelvis was performed following the standard protocol without intravenous contrast.  Comparison: 08/20/2007  Findings: Small focal areas of tree in bud infiltrates in the right lung base may represent alveolitis.  Healing fracture of the right lower anterior rib.  Coronary artery calcifications.  Punctate sized nonobstructing intrarenal stones bilaterally.  There is left-sided pyelocaliectasis and ureterectasis with left pararenal and periureteral stranding.  There is a punctate sized stone in the distal left ureter just above the ureterovesicle junction.  No right-sided pyelocaliectasis or ureterectasis.  No right ureteral or bladder stones.  The bladder wall is thickened suggesting cystitis.  Left pyelonephritis is not excluded.  The unenhanced appearance of the liver, spleen, gallbladder, pancreas, adrenal glands, abdominal aorta, and inferior vena cava are unremarkable except for vascular calcifications.  The stomach and small bowel are decompressed.  Stool filled colon without distension.  No free air or free fluid in the abdomen.  Pelvis:  Rectosigmoid colon is contrast filled.  No evidence of diverticulitis.  No free or loculated pelvic fluid collections. Mild enlargement of the prostate gland.  Appendix is not identified.  Degenerative changes in the lumbar spine and hips.  No destructive bone lesions appreciated.  IMPRESSION: Punctate sized stone in the distal left ureter with moderate proximal obstruction.  There is diffuse  bladder wall thickening which may represent cystitis.  Periureteral and pararenal stranding on the left is probably due to obstruction but pyelonephritis is not excluded.  Bilateral nonobstructing intrarenal stones.   Original Report Authenticated By: Burman Nieves, M.D.    Anti-infectives: Anti-infectives   Start     Dose/Rate Route Frequency Ordered Stop   06/26/13 1000  vancomycin (VANCOCIN) IVPB 750 mg/150 ml premix  Status:  Discontinued     750 mg 150 mL/hr over 60 Minutes Intravenous Every 24 hours 06/25/13 0829 06/25/13 0830   06/26/13 0600  cefTRIAXone (ROCEPHIN) 1 g in dextrose 5 % 50 mL IVPB     1 g 100 mL/hr over 30 Minutes Intravenous Every 24 hours 06/25/13 0817     06/25/13 0900  vancomycin (VANCOCIN) IVPB 750 mg/150 ml premix     750 mg 150 mL/hr over 60 Minutes  Intravenous Every 24 hours 06/25/13 0830     06/25/13 0830  vancomycin (VANCOCIN) IVPB 1000 mg/200 mL premix  Status:  Discontinued     1,000 mg 200 mL/hr over 60 Minutes Intravenous  Once 06/25/13 0829 06/25/13 0830   06/25/13 0800  cefTRIAXone (ROCEPHIN) 1 g in dextrose 5 % 50 mL IVPB  Status:  Discontinued     1 g 100 mL/hr over 30 Minutes Intravenous Every 24 hours 06/25/13 0746 06/25/13 0817   06/25/13 0545  cefTRIAXone (ROCEPHIN) 1 g in dextrose 5 % 50 mL IVPB     1 g 100 mL/hr over 30 Minutes Intravenous  Once 06/25/13 0532 06/25/13 1610      Current Facility-Administered Medications  Medication Dose Route Frequency Provider Last Rate Last Dose  . [START ON 06/26/2013] cefTRIAXone (ROCEPHIN) 1 g in dextrose 5 % 50 mL IVPB  1 g Intravenous Q24H Shanna Cisco, MD      . insulin aspart (novoLOG) injection 0-5 Units  0-5 Units Subcutaneous QHS Belkys A Regalado, MD      . insulin aspart (novoLOG) injection 0-9 Units  0-9 Units Subcutaneous TID WC Belkys A Regalado, MD      . vancomycin (VANCOCIN) IVPB 750 mg/150 ml premix  750 mg Intravenous Q24H Thuy Earlie Raveling, [facility]       Current Outpatient  Prescriptions  Medication Sig Dispense Refill  . acetaminophen (TYLENOL) 500 MG tablet Take 1,000 mg by mouth every 4 (four) hours as needed for pain.      . chlorthalidone (HYGROTON) 25 MG tablet Take 25 mg by mouth daily.      Marland Kitchen glimepiride (AMARYL) 4 MG tablet Take 4 mg by mouth daily before breakfast.      . ibuprofen (ADVIL,MOTRIN) 200 MG tablet Take 200 mg by mouth daily as needed for pain.      Marland Kitchen insulin aspart (NOVOLOG FLEXPEN) 100 UNIT/ML SOPN FlexPen Inject 6-10 Units into the skin 3 (three) times daily with meals. Based on sugar levels      . Insulin Glargine (LANTUS SOLOSTAR) 100 UNIT/ML SOPN Inject 20-30 Units into the skin at bedtime.      Marland Kitchen lisinopril (PRINIVIL,ZESTRIL) 5 MG tablet Take 5 mg by mouth daily.      Marland Kitchen PRESCRIPTION MEDICATION Place 1 drop into the right eye 4 (four) times daily. Eye drops from [facility]      . simvastatin (ZOCOR) 20 MG tablet Take 20 mg by mouth daily.       I have discussed his case with Dr. Sunnie Nielsen.  I have reviewed his labs and CT films including those dating back to 2008.   On my review there is some abnormality in the area of the Left seminal vesical and there is a central nodule off of the prostate.  He has some calcifications in the area of the SV and I can't really say that he has a stone.  On his CT in 2008 he had some left distal ureteral dilation with bladder wall thickening.     Assessment: I am no sure that he has a stone as the cause for obstruction and this might be a chronic issue.   His pain is not urologic but is more in the sacroiliac area, but I see no bone lesions on CT.   He does have appear to have a UTI with early sepsis but is doing better with initial hydration and antibiotic.   I have a concern that he has a prostate cancer  based on his prostate exam and he could have some ureteral obstruction related to this.    Plan: I am going to order a PSA. I don't think he needs cystoscopy with stenting or perc tube placement just  yet but if he worsens, A perc might be the best bet since his ureteral obstruction could be from tumor.  With the location of the pain a lumbosacral MRI might be of value to assess for a disc herniation or possible tumor  He has been seen and evaluated recently at [facility] cancer center for the anemia and it would be helpful to have those records.   I have put in the order for the records request.  I will continue to follow.   CC: Dr. Sunnie Nielsen.     LOS: 0 days    Pearlina Friedly J 06/25/2013

## 2013-06-25 NOTE — Progress Notes (Signed)
ANTIBIOTIC CONSULT NOTE - INITIAL  Pharmacy Consult:  Vancomycin Indication:  Sepsis / Pyelonephritis  No Known Allergies  Patient Measurements: Height: 5\' 4"  (162.6 cm) Weight: 110 lb (49.896 kg) IBW/kg (Calculated) : 59.2  Vital Signs: Temp: 99.9 F (37.7 C) (09/07 0238) Temp src: Oral (09/07 0238) BP: 134/77 mmHg (09/07 0700) Pulse Rate: 77 (09/07 0700) Intake/Output from previous day: 09/06 0701 - 09/07 0700 In: 1500 [I.V.:1500] Out: 150 [Urine:150]  Labs:  Recent Labs  06/25/13 0437  WBC 10.5  HGB 7.0*  PLT 305  CREATININE 1.64*   Estimated Creatinine Clearance: 30.8 ml/min (by C-G formula based on Cr of 1.64). No results found for this basename: VANCOTROUGH, VANCOPEAK, VANCORANDOM, GENTTROUGH, GENTPEAK, GENTRANDOM, TOBRATROUGH, TOBRAPEAK, TOBRARND, AMIKACINPEAK, AMIKACINTROU, AMIKACIN,  in the last 72 hours   Microbiology: No results found for this or any previous visit (from the past 720 hour(s)).  Medical History: Past Medical History  Diagnosis Date  . Diabetes mellitus without complication   . Hypertension   . Kidney stones       Assessment: 22 YOM complaining of lower back pain x 2 weeks.  Abdominal CT showed stone in the distal left ureter with moderate proximal obstruction, possible cystitis, and possible pyelonephritis.  Pharmacy consulted to manage vancomycin for sepsis and pyelonephritis.  Patient also has ARF as a result.   Goal of Therapy:  Vancomycin trough level 15-20 mcg/ml   Plan:  - Vanc 750mg  IV Q24H - Rocephin 1gm IV Q24H as ordered - Monitor renal fxn, clinical course, abx de-escalation plan, vanc trough as indicated    Irmgard Rampersaud D. Laney Potash, PharmD, BCPS Pager:  [phone] 06/25/2013, 8:28 AM

## 2013-06-25 NOTE — ED Provider Notes (Signed)
CSN: [id number]     Arrival date & time 06/25/13  0220 History   First MD Initiated Contact with Patient 06/25/13 0255     Chief Complaint  Patient presents with  . Flank Pain   (Consider location/radiation/quality/duration/timing/severity/associated sxs/prior Treatment) Patient is a 67 y.o. male presenting with flank pain. The history is provided by the patient. No language interpreter was used.  Flank Pain This is a new (mid low back to L flank) problem. The current episode started more than 1 week ago (2 weeks). The problem occurs daily. The problem has been gradually worsening. Pertinent negatives include no chest pain, no abdominal pain, no headaches and no shortness of breath. Associated symptoms comments: Penile pain, dysuria. Exacerbated by: urinating. Nothing relieves the symptoms. He has tried nothing for the symptoms. The treatment provided no relief.    Past Medical History  Diagnosis Date  . Diabetes mellitus without complication   . Hypertension   . Kidney stones    History reviewed. No pertinent past surgical history. History reviewed. No pertinent family history. History  Substance Use Topics  . Smoking status: Never Smoker   . Smokeless tobacco: Not on file  . Alcohol Use: No    Review of Systems  Constitutional: Positive for activity change, appetite change and unexpected weight change. Negative for fever and fatigue.  HENT: Negative for congestion, facial swelling, rhinorrhea and trouble swallowing.   Eyes: Negative for photophobia and pain.  Respiratory: Negative for cough, chest tightness and shortness of breath.   Cardiovascular: Negative for chest pain and leg swelling.  Gastrointestinal: Negative for nausea, vomiting, abdominal pain, diarrhea, constipation and blood in stool.  Endocrine: Negative for polydipsia and polyuria.  Genitourinary: Positive for dysuria and flank pain. Negative for urgency, decreased urine volume, scrotal swelling, difficulty  urinating and testicular pain.  Musculoskeletal: Negative for back pain and gait problem.  Skin: Positive for color change (pallor). Negative for rash and wound.  Allergic/Immunologic: Negative for immunocompromised state.  Neurological: Negative for dizziness, facial asymmetry, speech difficulty, weakness, numbness and headaches.  Psychiatric/Behavioral: Negative for confusion, decreased concentration and agitation.    Allergies  Review of patient's allergies indicates no known allergies.  Home Medications   Current Outpatient Rx  Name  Route  Sig  Dispense  Refill  . acetaminophen (TYLENOL) 500 MG tablet   Oral   Take 1,000 mg by mouth every 4 (four) hours as needed for pain.         . chlorthalidone (HYGROTON) 25 MG tablet   Oral   Take 25 mg by mouth daily.         Marland Kitchen glimepiride (AMARYL) 4 MG tablet   Oral   Take 4 mg by mouth daily before breakfast.         . ibuprofen (ADVIL,MOTRIN) 200 MG tablet   Oral   Take 200 mg by mouth daily as needed for pain.         Marland Kitchen insulin aspart (NOVOLOG FLEXPEN) 100 UNIT/ML SOPN FlexPen   Subcutaneous   Inject 6-10 Units into the skin 3 (three) times daily with meals. Based on sugar levels         . Insulin Glargine (LANTUS SOLOSTAR) 100 UNIT/ML SOPN   Subcutaneous   Inject 20-30 Units into the skin at bedtime.         Marland Kitchen lisinopril (PRINIVIL,ZESTRIL) 5 MG tablet   Oral   Take 5 mg by mouth daily.         Marland Kitchen PRESCRIPTION  MEDICATION   Right Eye   Place 1 drop into the right eye 4 (four) times daily. Eye drops from [facility]         . simvastatin (ZOCOR) 20 MG tablet   Oral   Take 20 mg by mouth daily.          BP 134/77  Pulse 77  Temp(Src) 99.9 F (37.7 C) (Oral)  Resp 11  SpO2 94% Physical Exam  Constitutional: He is oriented to person, place, and time. He appears listless. He appears cachectic. No distress.  HENT:  Head: Normocephalic and atraumatic.  Mouth/Throat: No oropharyngeal exudate.   Eyes: Pupils are equal, round, and reactive to light.  Neck: Normal range of motion. Neck supple.  Cardiovascular: Normal rate, regular rhythm and normal heart sounds.  Exam reveals no gallop and no friction rub.   No murmur heard. Pulmonary/Chest: Effort normal and breath sounds normal. No respiratory distress. He has no wheezes. He has no rales.  Abdominal: Soft. Bowel sounds are normal. He exhibits no distension and no mass. There is no tenderness. There is no rebound and no guarding.  Genitourinary: Guaiac negative stool. No penile tenderness.  Musculoskeletal: Normal range of motion. He exhibits no edema and no tenderness.  Neurological: He is oriented to person, place, and time. He appears listless.  Skin: Skin is warm and dry.  Psychiatric: He has a normal mood and affect.    ED Course  Procedures (including critical care time) Labs Review Labs Reviewed  CBC WITH DIFFERENTIAL - Abnormal; Notable for the following:    RBC 2.80 (*)    Hemoglobin 7.0 (*)    HCT 22.4 (*)    MCH 25.0 (*)    Neutrophils Relative % 87 (*)    Neutro Abs 9.1 (*)    Lymphocytes Relative 6 (*)    Lymphs Abs 0.6 (*)    All other components within normal limits  COMPREHENSIVE METABOLIC PANEL - Abnormal; Notable for the following:    Sodium 133 (*)    Glucose, Bld 127 (*)    BUN 39 (*)    Creatinine, Ser 1.64 (*)    Albumin 1.9 (*)    Total Bilirubin 0.1 (*)    GFR calc non Af Amer 42 (*)    GFR calc Af Amer 48 (*)    All other components within normal limits  URINALYSIS, ROUTINE W REFLEX MICROSCOPIC - Abnormal; Notable for the following:    APPearance CLOUDY (*)    Hgb urine dipstick MODERATE (*)    Protein, ur 100 (*)    Leukocytes, UA MODERATE (*)    All other components within normal limits  URINE MICROSCOPIC-ADD ON - Abnormal; Notable for the following:    Squamous Epithelial / LPF FEW (*)    Bacteria, UA FEW (*)    All other components within normal limits  CG4 I-STAT (LACTIC ACID) -  Abnormal; Notable for the following:    Lactic Acid, Venous 2.58 (*)    All other components within normal limits  URINE CULTURE  CULTURE, BLOOD (ROUTINE X 2)  CULTURE, BLOOD (ROUTINE X 2)  OCCULT BLOOD, POC DEVICE  TYPE AND SCREEN  PREPARE RBC (CROSSMATCH)  ABO/RH   Imaging Review Ct Abdomen Pelvis Wo Contrast  06/25/2013   *RADIOLOGY REPORT*  Clinical Data: Left flank pain starting 2 weeks ago.  Known kidney stones.  CT ABDOMEN AND PELVIS WITHOUT CONTRAST  Technique:  Multidetector CT imaging of the abdomen and pelvis was performed following  the standard protocol without intravenous contrast.  Comparison: 08/20/2007  Findings: Small focal areas of tree in bud infiltrates in the right lung base may represent alveolitis.  Healing fracture of the right lower anterior rib.  Coronary artery calcifications.  Punctate sized nonobstructing intrarenal stones bilaterally.  There is left-sided pyelocaliectasis and ureterectasis with left pararenal and periureteral stranding.  There is a punctate sized stone in the distal left ureter just above the ureterovesicle junction.  No right-sided pyelocaliectasis or ureterectasis.  No right ureteral or bladder stones.  The bladder wall is thickened suggesting cystitis.  Left pyelonephritis is not excluded.  The unenhanced appearance of the liver, spleen, gallbladder, pancreas, adrenal glands, abdominal aorta, and inferior vena cava are unremarkable except for vascular calcifications.  The stomach and small bowel are decompressed.  Stool filled colon without distension.  No free air or free fluid in the abdomen.  Pelvis:  Rectosigmoid colon is contrast filled.  No evidence of diverticulitis.  No free or loculated pelvic fluid collections. Mild enlargement of the prostate gland.  Appendix is not identified.  Degenerative changes in the lumbar spine and hips.  No destructive bone lesions appreciated.  IMPRESSION: Punctate sized stone in the distal left ureter with moderate  proximal obstruction.  There is diffuse bladder wall thickening which may represent cystitis.  Periureteral and pararenal stranding on the left is probably due to obstruction but pyelonephritis is not excluded.  Bilateral nonobstructing intrarenal stones.   Original Report Authenticated By: Burman Nieves, M.D.    MDM   1. UTI (lower urinary tract infection)   2. Anemia   3. Acute renal failure    Pt is a 67 y.o. male with Pmhx as above who presents with 2 weeks intermittent low back/L flank pain,  Dysuria, worse and now constant for about 15 hours.  Has also had several months weight loss, anemia of unknown cause requiring transfusions, but denies blood in stool, gums, for gross hematuria.  He has passed visible stone.  On PE, pt initially hypotensive in 80's systolic, cachectic, pale.  Abdominal exam benign.  +ttp low lumbar spine and L flank. W/u shows punctave stone in L UVJ with hydropnephrosis & standing concerning for pyelo.  Pt also has ARF and Hb 7.  IV rocephine given, will transfuse 1U PRBCs. [facility] will admit.   1. UTI (lower urinary tract infection)   2. Anemia   3. Acute renal failure         Shanna Cisco, MD 06/25/13 [phone]

## 2013-06-25 NOTE — Progress Notes (Signed)
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 pt just got back from MRI with CBG of 47 pt asymptomatic juice was given and CBG is up to 77 now  just started eating. MD was paged.

## 2013-06-25 NOTE — ED Notes (Signed)
Report from Woody, RN 

## 2013-06-26 LAB — CBC
MCH: 25.4 pg — ABNORMAL LOW (ref 26.0–34.0)
Platelets: 301 10*3/uL (ref 150–400)
RBC: 2.6 MIL/uL — ABNORMAL LOW (ref 4.22–5.81)
RDW: 15.9 % — ABNORMAL HIGH (ref 11.5–15.5)
WBC: 9.3 10*3/uL (ref 4.0–10.5)

## 2013-06-26 LAB — FERRITIN: Ferritin: 1904 ng/mL — ABNORMAL HIGH (ref 22–322)

## 2013-06-26 LAB — GLUCOSE, CAPILLARY
Glucose-Capillary: 96 mg/dL (ref 70–99)
Glucose-Capillary: 92 mg/dL (ref 70–99)
Glucose-Capillary: 149 mg/dL — ABNORMAL HIGH (ref 70–99)
Glucose-Capillary: 301 mg/dL — ABNORMAL HIGH (ref 70–99)

## 2013-06-26 LAB — VITAMIN B12: Vitamin B-12: 477 pg/mL (ref 211–911)

## 2013-06-26 LAB — COMPREHENSIVE METABOLIC PANEL
AST: 11 U/L (ref 0–37)
Alkaline Phosphatase: 110 U/L (ref 39–117)
BUN: 27 mg/dL — ABNORMAL HIGH (ref 6–23)
CO2: 23 mEq/L (ref 19–32)
Chloride: 103 mEq/L (ref 96–112)
Creatinine, Ser: 1.31 mg/dL (ref 0.50–1.35)
GFR calc non Af Amer: 55 mL/min — ABNORMAL LOW (ref >90)
Total Bilirubin: 0.2 mg/dL — ABNORMAL LOW (ref 0.3–1.2)

## 2013-06-26 LAB — PSA: PSA: 0.33 ng/mL (ref <=4.00)

## 2013-06-26 LAB — PREPARE RBC (CROSSMATCH)

## 2013-06-26 LAB — LACTIC ACID, PLASMA: Lactic Acid, Venous: 0.7 mmol/L (ref 0.5–2.2)

## 2013-06-26 MED ORDER — ENSURE COMPLETE PO LIQD
237.0000 mL | Freq: Two times a day (BID) | ORAL | Status: DC
  Administered 2013-06-26 – 2013-07-03 (×11): 237 mL via ORAL

## 2013-06-26 MED ORDER — ENOXAPARIN SODIUM 40 MG/0.4ML ~~LOC~~ SOLN
40.0000 mg | Freq: Every day | SUBCUTANEOUS | Status: DC
  Administered 2013-06-27 – 2013-07-04 (×8): 40 mg via SUBCUTANEOUS
  Filled 2013-06-26 (×8): qty 0.4

## 2013-06-26 MED ORDER — ENSURE COMPLETE PO LIQD
237.0000 mL | Freq: Two times a day (BID) | ORAL | Status: DC
  Administered 2013-06-26 – 2013-06-29 (×4): 237 mL via ORAL

## 2013-06-26 NOTE — Consult Note (Addendum)
WOC consult Note Reason for Consult: Consult requested for right leg wound.  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  He is followed by the outpatient wound care center and they have applied several "pigskin grafts" to promote healing.   He is due for a follow-up appointment after discharge this week. Wound type: Full thickness to right anterior calf Measurement: .4X2X.2cm Wound bed: dry and yellow, appearance consistent with skin substitute application. Drainage (amount, consistency, odor) scant amt yellow drainage, no odor Periwound: generalized erythremia to 3 cm surrounding wound Dressing procedure/placement/frequency: Applied hydrogel and foam dressing to protect graft site and promote moist healing. Pt can resume follow-up with outpatient wound care center after discharge for further plan of care. Please re-consult if further assistance is needed.  Thank-you,  Cammie Mcgee MSN, RN, CWOCN, [place], CNS [phone]

## 2013-06-26 NOTE — Consult Note (Signed)
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 Moderate right and mild left foraminal stenosis with mild bilateral subarticular lateral recess stenosis and moderate central stenosis secondary to posterior osseous ridging and facet arthropathy. Spurring deviates the right L3 nerve in the lateral extraforaminal space.  L4-5: Prominent left foraminal stenosis noted with moderate central stenosis, moderate left and mild right subarticular lateral recess stenosis secondary to broad left paracentral on lateral recess disc protrusion, facet arthropathy, and intervertebral spurring.  L5-S1: Moderate left foraminal stenosis due to intervertebral spurring. Facet spurring noted on the left.  MRI SACRUM:  Small hemangioma in the S1 vertebra noted. Trace right hip effusion. There is abnormal low  grade edema tracking along the psoas and iliacus muscles bilaterally in a symmetric fashion.  Low T1 signal intensity is present in the S3-4 level of the sacrum with a small central focus of higher inversion recovery weighted signal representing a small sacral lesion which is new compared to the 2008 examination is ; this lesion has intermediate T1 signal characteristics.  Subtle edema tracks along the piriformis musculature and along the gluteal tissue planes.  No definite generalized bony sclerosis on the CT scan from 06/25/2013.  Mild bilateral distal hydroureter.  IMPRESSION: LUMBAR SPINE:  1. Lumbar spondylosis and degenerative disc disease causing prominent impingement at L4-5 and moderate impingement at L3-4 and L5-S1 as detailed above. 2. Reduced T1 and T2 marrow signal. Although this can be countered in setting such as myelofibrosis and mastocytosis, as well as hemosiderosis, we do not demonstrate prominent marrow sclerosis on the CT images. Infiltrative marrow disorder is a differential diagnostic consideration. Red marrow reconversion in light of the patient's anemia is a possibility, but typically the would have high T2 signal rather than low T2 signal. Bone marrow biopsy could be utilized for further characterization of the marrow. SACRUM:  1. Small nonspecific lesion in the S3 vertebral body with adjacent low T1 signal. This lesion has faintly elevated T2 signal. No significant extraosseous component. This may be a small chondroid or vascular lesion but was not visible in 2008. Localized amyloid deposition could have a similar appearance. This may warrant followup imaging to exclude progression/malignancy. 2. Small hemangioma in the S1 vertebral body. 3. Diffuse edema tracking along paraspinal, iliopsoas, and pelvic muscle groups compatible with 3rd spacing of fluid, potentially related to the patient's hypoalbuminemia. 4. Distal hydroureter bilaterally.   Electronically Signed   By: Herbie Baltimore  On: 06/25/2013 13:22     Assessment/Plan: Patient with multiple significant medical problems including poorly-controlled diabetes for at least 14 years, hypertension, nephrolithiasis, anemia,  low iron, and severe hypoalbuminemia. He presented early yesterday with a 2-3 week history of urinary system problems including difficulty voiding, urinary frequency and urgency, and probable nephrolithiasis, as well as low back pain. Workup included a CT scan of the abdomen and pelvis which did confirm nephrolithiasis with a ureteral stone and proximal obstruction, evidence of acute renal failure with an elevated BUN and creatinine, evidence of hemoglobin, protein, and white cells in the urine, elevated hemoglobin A1c, profound hypo-albuminemia, and significant anemia with a hemoglobin today of 6.6 (after having received a transfusion last week).  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 Symptomatically he complains of low back pain, but does not describe any radicular pain, numbness, procedures, or weakness. He does have evidence of decreased pinprick and position sensation in the distal lower extremities consistent with neuropathy, presumably diabetic neuropathy related to his long-standing diabetes.  At this time I am unconvinced that the degenerative changes in his lower back are responsible for his low back pain. It may well be that the underlying process resulting in the change in the appearance of his bone marrow on MRI may be the source of his back discomfort. I do not feel that there is a role for neurosurgical intervention at this time, and further he is certainly not a candidate for surgical intervention in consideration of his profound hypoalbuminemia and anemia. Dr. Sunnie Nielsen asked whether I would recommend treatment with prednisone, which I explained I do not  favor for multiple reasons, not the least of which is his underlying poorly controlled diabetes. Rather I favor definitive workup and treatment of his significant underlying medical derangements. This may well include hematology consultation. Patient may benefit from physical therapy. As regards pain management, I certainly would recommend limiting the use of narcotic analgesics as much as feasible. As regards followup, at this time I do not feel that following hospitalization further neurosurgical followup is indicated. Rather he will certainly need to followup with his primary physician as well as with the other medical physicians responsible for the evaluation, treatment, and care of his significant multiple medical problems.   Hewitt Shorts, MD 06/26/2013, 2:16 PM

## 2013-06-26 NOTE — Progress Notes (Signed)
Patient ID: Miguel Leblanc, male   DOB: 03-05-1946, 67 y.o.   MRN: [id number]    Subjective: Miguel Leblanc is having less pain today but still hurts when he moves.   He continues to deny voiding complaints or flank pain.   His PSA was only 0.33 and the LS MRI was significant for lumbar spondylosis and DDD with impingement at L4-5 along with L3-4 and L5-S1.   There was reduced marrow signal and also a non-specific lesion at S3.   He has edema in the pelvic muscle groups and bilateral distal hydroureter.  He is more anemia with a hgb of 6.6 probably secondary to dilution.  His Cr is down to normal. ROS: Negative except as above.   He has no nausea or fever.   Objective: Vital signs in last 24 hours: Temp:  [98.3 F (36.8 C)-99 F (37.2 C)] 98.3 F (36.8 C) (09/08 0650) Pulse Rate:  [66-128] 72 (09/08 0650) Resp:  [12-32] 13 (09/08 0650) BP: (90-178)/(57-104) 126/76 mmHg (09/08 0650) SpO2:  [94 %-100 %] 100 % (09/08 0650) Weight:  [49.896 kg (110 lb)-50.3 kg (110 lb 14.3 oz)] 50.3 kg (110 lb 14.3 oz) (09/08 0419)  Intake/Output from previous day: 09/07 0701 - 09/08 0700 In: 4164 [P.O.:840; I.V.:3124; IV Piggyback:200] Out: 1450 [Urine:1450] Intake/Output this shift:    General appearance: alert and no distress  Lab Results:   Recent Labs  06/25/13 1100 06/26/13 0525  WBC 10.6* 9.3  HGB 8.2* 6.6*  HCT 25.9* 21.0*  PLT 394 301   BMET  Recent Labs  06/25/13 0437 06/25/13 1100 06/26/13 0525  NA 133*  --  132*  K 4.3  --  4.7  CL 98  --  103  CO2 24  --  23  GLUCOSE 127*  --  76  BUN 39*  --  27*  CREATININE 1.64* 1.34 1.31  CALCIUM 8.7  --  8.5   PT/INR No results found for this basename: LABPROT, INR,  in the last 72 hours ABG No results found for this basename: PHART, PCO2, PO2, HCO3,  in the last 72 hours  Studies/Results: Ct Abdomen Pelvis Wo Contrast  06/25/2013   *RADIOLOGY REPORT*  Clinical Data: Left flank pain starting 2 weeks ago.  Known kidney stones.  CT  ABDOMEN AND PELVIS WITHOUT CONTRAST  Technique:  Multidetector CT imaging of the abdomen and pelvis was performed following the standard protocol without intravenous contrast.  Comparison: 08/20/2007  Findings: Small focal areas of tree in bud infiltrates in the right lung base may represent alveolitis.  Healing fracture of the right lower anterior rib.  Coronary artery calcifications.  Punctate sized nonobstructing intrarenal stones bilaterally.  There is left-sided pyelocaliectasis and ureterectasis with left pararenal and periureteral stranding.  There is a punctate sized stone in the distal left ureter just above the ureterovesicle junction.  No right-sided pyelocaliectasis or ureterectasis.  No right ureteral or bladder stones.  The bladder wall is thickened suggesting cystitis.  Left pyelonephritis is not excluded.  The unenhanced appearance of the liver, spleen, gallbladder, pancreas, adrenal glands, abdominal aorta, and inferior vena cava are unremarkable except for vascular calcifications.  The stomach and small bowel are decompressed.  Stool filled colon without distension.  No free air or free fluid in the abdomen.  Pelvis:  Rectosigmoid colon is contrast filled.  No evidence of diverticulitis.  No free or loculated pelvic fluid collections. Mild enlargement of the prostate gland.  Appendix is not identified.  Degenerative changes  in the lumbar spine and hips.  No destructive bone lesions appreciated.  IMPRESSION: Punctate sized stone in the distal left ureter with moderate proximal obstruction.  There is diffuse bladder wall thickening which may represent cystitis.  Periureteral and pararenal stranding on the left is probably due to obstruction but pyelonephritis is not excluded.  Bilateral nonobstructing intrarenal stones.   Original Report Authenticated By: Burman Nieves, M.D.   Mr Lumbar Spine Wo Contrast  06/25/2013   CLINICAL DATA:  Low back pain. Diabetes. Hypertension.  EXAM: MRI LUMBAR SPINE  WITHOUT CONTRAST; MR SACRUM WITHOUT CONTRAST  TECHNIQUE: Multiplanar, multisequence MR imaging was performed. No intravenous contrast was administered.  COMPARISON:  CT scan dated 06/25/2013  FINDINGS: MRI LUMBAR SPINE:  Reduced generalized marrow signal in the lumbar spine on T1 weighted images. Low under during recovery weighted diffuse marrow signal observed, although some of this may be attributable to chemical shift artifact given the exaggerated chemical shift artifact on the T2 axial images.  The lowest lumbar type non-rib-bearing vertebra is labeled as L5. The conus medullaris appears normal. Conus level: L1.  A hemangioma is present in the S1 vertebral body.  Paraspinal edema noted. Degenerative endplate findings noted at L3-4 and L5-S1 with loss of disc height particularly at L3-4. Additional findings at individual levels are as follows: L1-2: Unremarkable.  L2-3: No impingement. Disk bulge abuts but does not displace the left L3 nerve in the lateral extraforaminal space.  L3-4: Moderate right and mild left foraminal stenosis with mild bilateral subarticular lateral recess stenosis and moderate central stenosis secondary to posterior osseous ridging and facet arthropathy. Spurring deviates the right L3 nerve in the lateral extraforaminal space.  L4-5: Prominent left foraminal stenosis noted with moderate central stenosis, moderate left and mild right subarticular lateral recess stenosis secondary to broad left paracentral on lateral recess disc protrusion, facet arthropathy, and intervertebral spurring.  L5-S1: Moderate left foraminal stenosis due to intervertebral spurring. Facet spurring noted on the left.  MRI SACRUM:  Small hemangioma in the S1 vertebra noted. Trace right hip effusion. There is abnormal low grade edema tracking along the psoas and iliacus muscles bilaterally in a symmetric fashion.  Low T1 signal intensity is present in the S3-4 level of the sacrum with a small central focus of higher  inversion recovery weighted signal representing a small sacral lesion which is new compared to the 2008 examination is ; this lesion has intermediate T1 signal characteristics.  Subtle edema tracks along the piriformis musculature and along the gluteal tissue planes.  No definite generalized bony sclerosis on the CT scan from 06/25/2013.  Mild bilateral distal hydroureter.  IMPRESSION: LUMBAR SPINE:  1. Lumbar spondylosis and degenerative disc disease causing prominent impingement at L4-5 and moderate impingement at L3-4 and L5-S1 as detailed above. 2. Reduced T1 and T2 marrow signal. Although this can be countered in setting such as myelofibrosis and mastocytosis, as well as hemosiderosis, we do not demonstrate prominent marrow sclerosis on the CT images. Infiltrative marrow disorder is a differential diagnostic consideration. Red marrow reconversion in light of the patient's anemia is a possibility, but typically the would have high T2 signal rather than low T2 signal. Bone marrow biopsy could be utilized for further characterization of the marrow. SACRUM:  1. Small nonspecific lesion in the S3 vertebral body with adjacent low T1 signal. This lesion has faintly elevated T2 signal. No significant extraosseous component. This may be a small chondroid or vascular lesion but was not visible in 2008. Localized amyloid  deposition could have a similar appearance. This may warrant followup imaging to exclude progression/malignancy. 2. Small hemangioma in the S1 vertebral body. 3. Diffuse edema tracking along paraspinal, iliopsoas, and pelvic muscle groups compatible with 3rd spacing of fluid, potentially related to the patient's hypoalbuminemia. 4. Distal hydroureter bilaterally.   Electronically Signed   By: Herbie Baltimore   On: 06/25/2013 13:22   Mr Sacrum/si Joints Wo Contrast  06/25/2013   CLINICAL DATA:  Low back pain. Diabetes. Hypertension.  EXAM: MRI LUMBAR SPINE WITHOUT CONTRAST; MR SACRUM WITHOUT CONTRAST   TECHNIQUE: Multiplanar, multisequence MR imaging was performed. No intravenous contrast was administered.  COMPARISON:  CT scan dated 06/25/2013  FINDINGS: MRI LUMBAR SPINE:  Reduced generalized marrow signal in the lumbar spine on T1 weighted images. Low under during recovery weighted diffuse marrow signal observed, although some of this may be attributable to chemical shift artifact given the exaggerated chemical shift artifact on the T2 axial images.  The lowest lumbar type non-rib-bearing vertebra is labeled as L5. The conus medullaris appears normal. Conus level: L1.  A hemangioma is present in the S1 vertebral body.  Paraspinal edema noted. Degenerative endplate findings noted at L3-4 and L5-S1 with loss of disc height particularly at L3-4. Additional findings at individual levels are as follows: L1-2: Unremarkable.  L2-3: No impingement. Disk bulge abuts but does not displace the left L3 nerve in the lateral extraforaminal space.  L3-4: Moderate right and mild left foraminal stenosis with mild bilateral subarticular lateral recess stenosis and moderate central stenosis secondary to posterior osseous ridging and facet arthropathy. Spurring deviates the right L3 nerve in the lateral extraforaminal space.  L4-5: Prominent left foraminal stenosis noted with moderate central stenosis, moderate left and mild right subarticular lateral recess stenosis secondary to broad left paracentral on lateral recess disc protrusion, facet arthropathy, and intervertebral spurring.  L5-S1: Moderate left foraminal stenosis due to intervertebral spurring. Facet spurring noted on the left.  MRI SACRUM:  Small hemangioma in the S1 vertebra noted. Trace right hip effusion. There is abnormal low grade edema tracking along the psoas and iliacus muscles bilaterally in a symmetric fashion.  Low T1 signal intensity is present in the S3-4 level of the sacrum with a small central focus of higher inversion recovery weighted signal  representing a small sacral lesion which is new compared to the 2008 examination is ; this lesion has intermediate T1 signal characteristics.  Subtle edema tracks along the piriformis musculature and along the gluteal tissue planes.  No definite generalized bony sclerosis on the CT scan from 06/25/2013.  Mild bilateral distal hydroureter.  IMPRESSION: LUMBAR SPINE:  1. Lumbar spondylosis and degenerative disc disease causing prominent impingement at L4-5 and moderate impingement at L3-4 and L5-S1 as detailed above. 2. Reduced T1 and T2 marrow signal. Although this can be countered in setting such as myelofibrosis and mastocytosis, as well as hemosiderosis, we do not demonstrate prominent marrow sclerosis on the CT images. Infiltrative marrow disorder is a differential diagnostic consideration. Red marrow reconversion in light of the patient's anemia is a possibility, but typically the would have high T2 signal rather than low T2 signal. Bone marrow biopsy could be utilized for further characterization of the marrow. SACRUM:  1. Small nonspecific lesion in the S3 vertebral body with adjacent low T1 signal. This lesion has faintly elevated T2 signal. No significant extraosseous component. This may be a small chondroid or vascular lesion but was not visible in 2008. Localized amyloid deposition could have a similar  appearance. This may warrant followup imaging to exclude progression/malignancy. 2. Small hemangioma in the S1 vertebral body. 3. Diffuse edema tracking along paraspinal, iliopsoas, and pelvic muscle groups compatible with 3rd spacing of fluid, potentially related to the patient's hypoalbuminemia. 4. Distal hydroureter bilaterally.   Electronically Signed   By: Herbie Baltimore   On: 06/25/2013 13:22    Anti-infectives: Anti-infectives   Start     Dose/Rate Route Frequency Ordered Stop   06/26/13 1000  vancomycin (VANCOCIN) IVPB 750 mg/150 ml premix  Status:  Discontinued     750 mg 150 mL/hr over 60  Minutes Intravenous Every 24 hours 06/25/13 0829 06/25/13 0830   06/26/13 0600  cefTRIAXone (ROCEPHIN) 1 g in dextrose 5 % 50 mL IVPB     1 g 100 mL/hr over 30 Minutes Intravenous Every 24 hours 06/25/13 0817     06/25/13 0900  vancomycin (VANCOCIN) IVPB 750 mg/150 ml premix     750 mg 150 mL/hr over 60 Minutes Intravenous Every 24 hours 06/25/13 0830     06/25/13 0830  vancomycin (VANCOCIN) IVPB 1000 mg/200 mL premix  Status:  Discontinued     1,000 mg 200 mL/hr over 60 Minutes Intravenous  Once 06/25/13 0829 06/25/13 0830   06/25/13 0800  cefTRIAXone (ROCEPHIN) 1 g in dextrose 5 % 50 mL IVPB  Status:  Discontinued     1 g 100 mL/hr over 30 Minutes Intravenous Every 24 hours 06/25/13 0746 06/25/13 0817   06/25/13 0545  cefTRIAXone (ROCEPHIN) 1 g in dextrose 5 % 50 mL IVPB     1 g 100 mL/hr over 30 Minutes Intravenous  Once 06/25/13 0532 06/25/13 9811      Current Facility-Administered Medications  Medication Dose Route Frequency Provider Last Rate Last Dose  . 0.9 %  sodium chloride infusion   Intravenous Continuous Belkys A Regalado, MD 125 mL/hr at 06/26/13 0106    . acetaminophen (TYLENOL) tablet 650 mg  650 mg Oral Q4H PRN Belkys A Regalado, MD      . cefTRIAXone (ROCEPHIN) 1 g in dextrose 5 % 50 mL IVPB  1 g Intravenous Q24H Shanna Cisco, MD   1 g at 06/26/13 [phone]  . Chlorhexidine Gluconate Cloth 2 % PADS 6 each  6 each Topical Q0600 Alba Cory, MD   6 each at 06/26/13 [phone]  . enoxaparin (LOVENOX) injection 30 mg  30 mg Subcutaneous Q24H Belkys A Regalado, MD   30 mg at 06/25/13 1335  . HYDROcodone-acetaminophen (NORCO/VICODIN) 5-325 MG per tablet 1-2 tablet  1-2 tablet Oral Q4H PRN Alba Cory, MD   1 tablet at 06/26/13 0106  . insulin aspart (novoLOG) injection 0-5 Units  0-5 Units Subcutaneous QHS Belkys A Regalado, MD      . insulin aspart (novoLOG) injection 0-9 Units  0-9 Units Subcutaneous TID WC Belkys A Regalado, MD      . morphine 2 MG/ML injection 1 mg   1 mg Intravenous Q3H PRN Belkys A Regalado, MD   1 mg at 06/25/13 1115  . mupirocin ointment (BACTROBAN) 2 % 1 application  1 application Nasal BID Alba Cory, MD   1 application at 06/25/13 2106  . ondansetron (ZOFRAN) tablet 4 mg  4 mg Oral Q6H PRN Belkys A Regalado, MD       Or  . ondansetron (ZOFRAN) injection 4 mg  4 mg Intravenous Q6H PRN Belkys A Regalado, MD      . simvastatin (ZOCOR) tablet 20 mg  20  mg Oral Daily Belkys A Regalado, MD   20 mg at 06/25/13 1335  . sodium chloride 0.9 % injection 3 mL  3 mL Intravenous Q12H Belkys A Regalado, MD   3 mL at 06/25/13 2106  . vancomycin (VANCOCIN) IVPB 750 mg/150 ml premix  750 mg Intravenous Q24H Thuy Dien Dang, RPH   750 mg at 06/25/13 1331   Labs and MRI films and report reviewed.  I don't see any records from [facility] yet.   Assessment: He continues to have low back pain and has MRI findings of spinal stenosis and DDD with impingement of the L3 -S1 levels which is worst at L4-5.   His PSA is low at 0.33 but with his nodular prostate and possible ureteral obstruction, this is actually worrisome for a high grade prostate cancer.   He has marrow abnormalities on MRI but nothing to suggest bone mets.   Plan: Suggest orthopedic or neurosurgical consultation for the back pain and MRI findings.   I have requested records from [facility] but they are not available, but he may need a hematology consultation.   He needs a cystoscopy with retrograde pyelography and prostate [place] and biopsy for assessment of the prostate nodule and ureteral dilation and I reviewed the risks of the procedure including bleeding, infection and difficulty voiding along with anesthetic risks.   I will try to get him scheduled but it might not be during this hospitalization.       LOS: 1 day    Kenzel Ruesch J 06/26/2013

## 2013-06-26 NOTE — Progress Notes (Signed)
[facility] PROGRESS NOTE  Miguel Leblanc BJY:[id number] DOB: Sep 12, 1946 DOA: 06/25/2013 PCP: Pcp Not In System  Assessment/Plan:  1-Sepsis: patient presents with hypotension, mild lactic acidosis, Tachycardia. Source of infection ? Pyelonephritis. UA with 21 to 50 WBC.  -Continue with fluids, IV antibiotics ceftriaxone, blood culture pending, repeat lactic acid decrease to 0.7. Urine culture pending.   2-Stone in the distal left ureter with moderate proximal obstruction// Pyelonephritis: Continue with IV fluids, IV ceftriaxone, follow up urine culture. Urology Dr Wilson Singer consulted. Pain management with morphine. Patient will need Cystoscopy with retrograde pyelogram and prostate US biopsy. Dr Wilson Singer will arrange follow up. PAS at 0.33  3-Acute renal failure.; Cr peak to 1.6. Likely obstructive uropathy secondary to ureter stone. Continue with IV fluids, IV antibiotics, Urology consulted. Cr has decrease to 1.3.   4-Diabetes: episodes of hypoglycemia. Holding lantus. Not eating enough. SSI.   5-Anemia: no evidence of bleeding. He will need further evaluation. Anemia panel ferritin 1904, folate 12, B 12 447. Transfuse 1 units PRBC. Anemia panel consistent with anemia of chronic diseases. Reduced T 1 and T 2 marrow signal. He will need to follow up with hematology, he will need bone marrow biopsy. Waiting for records.   6-Mild Hyponatremia: in setting of dehydration, renal failure. Continue with IV fluids.   7-Chronic Right LE ulcer: no significant drainage, wound care consulted. Will stop vancomycin.   8-Back pain? MRI with : Lumbar spondylosis and degenerative disc disease causing prominent impingement at L4-5 and moderate impingement at L3-4 and  L5-S 1. Small nonspecific lesion in the S3 vertebral body with adjacent low T1 signal This may be a small chondroid or vascular lesion but was not visible in 2008. Localized amyloid deposition could have a similar appearance. This may warrant followup  imaging to exclude progression/malignancy. Small hemangioma in the S1 vertebral body. -Neurosurgery consulted. Pain management.       Code Status: Full Code.  Family Communication: Care discussed with Patient.  Disposition Plan:    Consultants: Dr Wilson Singer  Procedures:  none  Antibiotics:  Ceftriaxone 9-8  Vancomycin 9-8  HPI/Subjective: feeling better. Still complaining of back pain.   Objective: Filed Vitals:   06/26/13 0730  BP: 121/66  Pulse: 81  Temp:   Resp: 15    Intake/Output Summary (Last 24 hours) at 06/26/13 0739 Last data filed at 06/26/13 0710  Gross per 24 hour  Intake 4176.5 ml  Output   1450 ml  Net 2726.5 ml   Filed Weights   06/25/13 0712 06/26/13 0419  Weight: 49.896 kg (110 lb) 50.3 kg (110 lb 14.3 oz)    Exam:   General:  No distress.   Cardiovascular: S 1, S 2 RRR  Respiratory: CTA  Abdomen: BS present, soft, nt  Musculoskeletal: no edema.   Data Reviewed: Basic Metabolic Panel:  Recent Labs Lab 06/25/13 0437 06/25/13 1100 06/26/13 0525  NA 133*  --  132*  K 4.3  --  4.7  CL 98  --  103  CO2 24  --  23  GLUCOSE 127*  --  76  BUN 39*  --  27*  CREATININE 1.64* 1.34 1.31  CALCIUM 8.7  --  8.5   Liver Function Tests:  Recent Labs Lab 06/25/13 0437 06/26/13 0525  AST 12 11  ALT 6 5  ALKPHOS 105 110  BILITOT 0.1* 0.2*  PROT 6.9 6.3  ALBUMIN 1.9* 1.6*   No results found for this basename: LIPASE, AMYLASE,  in the last  168 hours No results found for this basename: AMMONIA,  in the last 168 hours CBC:  Recent Labs Lab 06/25/13 0437 06/25/13 1100 06/26/13 0525  WBC 10.5 10.6* 9.3  NEUTROABS 9.1*  --   --   HGB 7.0* 8.2* 6.6*  HCT 22.4* 25.9* 21.0*  MCV 80.0 79.9 80.8  PLT 305 394 301   Cardiac Enzymes: No results found for this basename: CKTOTAL, CKMB, CKMBINDEX, TROPONINI,  in the last 168 hours BNP (last 3 results) No results found for this basename: PROBNP,  in the last 8760 hours CBG:  Recent  Labs Lab 06/25/13 1344 06/25/13 1411 06/25/13 1659 06/25/13 2156 06/26/13 0108  GLUCAP 47* 76 115* 70 96    Recent Results (from the past 240 hour(s))  MRSA PCR SCREENING     Status: Abnormal   Collection Time    06/25/13 10:32 AM      Result Value Range Status   MRSA by PCR POSITIVE (*) NEGATIVE Final   Comment:            The GeneXpert MRSA Assay (FDA     approved for NASAL specimens     only), is one component of a     comprehensive MRSA colonization     surveillance program. It is not     intended to diagnose MRSA     infection nor to guide or     monitor treatment for     MRSA infections.     RESULT CALLED TO, READ BACK BY AND VERIFIED WITH:     AGUIRRI,A RN 06/25/13 1304 WOOTEN,K     Studies: Ct Abdomen Pelvis Wo Contrast  06/25/2013   *RADIOLOGY REPORT*  Clinical Data: Left flank pain starting 2 weeks ago.  Known kidney stones.  CT ABDOMEN AND PELVIS WITHOUT CONTRAST  Technique:  Multidetector CT imaging of the abdomen and pelvis was performed following the standard protocol without intravenous contrast.  Comparison: 08/20/2007  Findings: Small focal areas of tree in bud infiltrates in the right lung base may represent alveolitis.  Healing fracture of the right lower anterior rib.  Coronary artery calcifications.  Punctate sized nonobstructing intrarenal stones bilaterally.  There is left-sided pyelocaliectasis and ureterectasis with left pararenal and periureteral stranding.  There is a punctate sized stone in the distal left ureter just above the ureterovesicle junction.  No right-sided pyelocaliectasis or ureterectasis.  No right ureteral or bladder stones.  The bladder wall is thickened suggesting cystitis.  Left pyelonephritis is not excluded.  The unenhanced appearance of the liver, spleen, gallbladder, pancreas, adrenal glands, abdominal aorta, and inferior vena cava are unremarkable except for vascular calcifications.  The stomach and small bowel are decompressed.  Stool  filled colon without distension.  No free air or free fluid in the abdomen.  Pelvis:  Rectosigmoid colon is contrast filled.  No evidence of diverticulitis.  No free or loculated pelvic fluid collections. Mild enlargement of the prostate gland.  Appendix is not identified.  Degenerative changes in the lumbar spine and hips.  No destructive bone lesions appreciated.  IMPRESSION: Punctate sized stone in the distal left ureter with moderate proximal obstruction.  There is diffuse bladder wall thickening which may represent cystitis.  Periureteral and pararenal stranding on the left is probably due to obstruction but pyelonephritis is not excluded.  Bilateral nonobstructing intrarenal stones.   Original Report Authenticated By: Burman Nieves, M.D.   Mr Lumbar Spine Wo Contrast  06/25/2013   CLINICAL DATA:  Low back pain. Diabetes. Hypertension.  EXAM: MRI LUMBAR SPINE WITHOUT CONTRAST; MR SACRUM WITHOUT CONTRAST  TECHNIQUE: Multiplanar, multisequence MR imaging was performed. No intravenous contrast was administered.  COMPARISON:  CT scan dated 06/25/2013  FINDINGS: MRI LUMBAR SPINE:  Reduced generalized marrow signal in the lumbar spine on T1 weighted images. Low under during recovery weighted diffuse marrow signal observed, although some of this may be attributable to chemical shift artifact given the exaggerated chemical shift artifact on the T2 axial images.  The lowest lumbar type non-rib-bearing vertebra is labeled as L5. The conus medullaris appears normal. Conus level: L1.  A hemangioma is present in the S1 vertebral body.  Paraspinal edema noted. Degenerative endplate findings noted at L3-4 and L5-S1 with loss of disc height particularly at L3-4. Additional findings at individual levels are as follows: L1-2: Unremarkable.  L2-3: No impingement. Disk bulge abuts but does not displace the left L3 nerve in the lateral extraforaminal space.  L3-4: Moderate right and mild left foraminal stenosis with mild  bilateral subarticular lateral recess stenosis and moderate central stenosis secondary to posterior osseous ridging and facet arthropathy. Spurring deviates the right L3 nerve in the lateral extraforaminal space.  L4-5: Prominent left foraminal stenosis noted with moderate central stenosis, moderate left and mild right subarticular lateral recess stenosis secondary to broad left paracentral on lateral recess disc protrusion, facet arthropathy, and intervertebral spurring.  L5-S1: Moderate left foraminal stenosis due to intervertebral spurring. Facet spurring noted on the left.  MRI SACRUM:  Small hemangioma in the S1 vertebra noted. Trace right hip effusion. There is abnormal low grade edema tracking along the psoas and iliacus muscles bilaterally in a symmetric fashion.  Low T1 signal intensity is present in the S3-4 level of the sacrum with a small central focus of higher inversion recovery weighted signal representing a small sacral lesion which is new compared to the 2008 examination is ; this lesion has intermediate T1 signal characteristics.  Subtle edema tracks along the piriformis musculature and along the gluteal tissue planes.  No definite generalized bony sclerosis on the CT scan from 06/25/2013.  Mild bilateral distal hydroureter.  IMPRESSION: LUMBAR SPINE:  1. Lumbar spondylosis and degenerative disc disease causing prominent impingement at L4-5 and moderate impingement at L3-4 and L5-S1 as detailed above. 2. Reduced T1 and T2 marrow signal. Although this can be countered in setting such as myelofibrosis and mastocytosis, as well as hemosiderosis, we do not demonstrate prominent marrow sclerosis on the CT images. Infiltrative marrow disorder is a differential diagnostic consideration. Red marrow reconversion in light of the patient's anemia is a possibility, but typically the would have high T2 signal rather than low T2 signal. Bone marrow biopsy could be utilized for further characterization of the  marrow. SACRUM:  1. Small nonspecific lesion in the S3 vertebral body with adjacent low T1 signal. This lesion has faintly elevated T2 signal. No significant extraosseous component. This may be a small chondroid or vascular lesion but was not visible in 2008. Localized amyloid deposition could have a similar appearance. This may warrant followup imaging to exclude progression/malignancy. 2. Small hemangioma in the S1 vertebral body. 3. Diffuse edema tracking along paraspinal, iliopsoas, and pelvic muscle groups compatible with 3rd spacing of fluid, potentially related to the patient's hypoalbuminemia. 4. Distal hydroureter bilaterally.   Electronically Signed   By: Herbie Baltimore   On: 06/25/2013 13:22   Mr Sacrum/si Joints Wo Contrast  06/25/2013   CLINICAL DATA:  Low back pain. Diabetes. Hypertension.  EXAM: MRI LUMBAR SPINE WITHOUT  CONTRAST; MR SACRUM WITHOUT CONTRAST  TECHNIQUE: Multiplanar, multisequence MR imaging was performed. No intravenous contrast was administered.  COMPARISON:  CT scan dated 06/25/2013  FINDINGS: MRI LUMBAR SPINE:  Reduced generalized marrow signal in the lumbar spine on T1 weighted images. Low under during recovery weighted diffuse marrow signal observed, although some of this may be attributable to chemical shift artifact given the exaggerated chemical shift artifact on the T2 axial images.  The lowest lumbar type non-rib-bearing vertebra is labeled as L5. The conus medullaris appears normal. Conus level: L1.  A hemangioma is present in the S1 vertebral body.  Paraspinal edema noted. Degenerative endplate findings noted at L3-4 and L5-S1 with loss of disc height particularly at L3-4. Additional findings at individual levels are as follows: L1-2: Unremarkable.  L2-3: No impingement. Disk bulge abuts but does not displace the left L3 nerve in the lateral extraforaminal space.  L3-4: Moderate right and mild left foraminal stenosis with mild bilateral subarticular lateral recess  stenosis and moderate central stenosis secondary to posterior osseous ridging and facet arthropathy. Spurring deviates the right L3 nerve in the lateral extraforaminal space.  L4-5: Prominent left foraminal stenosis noted with moderate central stenosis, moderate left and mild right subarticular lateral recess stenosis secondary to broad left paracentral on lateral recess disc protrusion, facet arthropathy, and intervertebral spurring.  L5-S1: Moderate left foraminal stenosis due to intervertebral spurring. Facet spurring noted on the left.  MRI SACRUM:  Small hemangioma in the S1 vertebra noted. Trace right hip effusion. There is abnormal low grade edema tracking along the psoas and iliacus muscles bilaterally in a symmetric fashion.  Low T1 signal intensity is present in the S3-4 level of the sacrum with a small central focus of higher inversion recovery weighted signal representing a small sacral lesion which is new compared to the 2008 examination is ; this lesion has intermediate T1 signal characteristics.  Subtle edema tracks along the piriformis musculature and along the gluteal tissue planes.  No definite generalized bony sclerosis on the CT scan from 06/25/2013.  Mild bilateral distal hydroureter.  IMPRESSION: LUMBAR SPINE:  1. Lumbar spondylosis and degenerative disc disease causing prominent impingement at L4-5 and moderate impingement at L3-4 and L5-S1 as detailed above. 2. Reduced T1 and T2 marrow signal. Although this can be countered in setting such as myelofibrosis and mastocytosis, as well as hemosiderosis, we do not demonstrate prominent marrow sclerosis on the CT images. Infiltrative marrow disorder is a differential diagnostic consideration. Red marrow reconversion in light of the patient's anemia is a possibility, but typically the would have high T2 signal rather than low T2 signal. Bone marrow biopsy could be utilized for further characterization of the marrow. SACRUM:  1. Small nonspecific  lesion in the S3 vertebral body with adjacent low T1 signal. This lesion has faintly elevated T2 signal. No significant extraosseous component. This may be a small chondroid or vascular lesion but was not visible in 2008. Localized amyloid deposition could have a similar appearance. This may warrant followup imaging to exclude progression/malignancy. 2. Small hemangioma in the S1 vertebral body. 3. Diffuse edema tracking along paraspinal, iliopsoas, and pelvic muscle groups compatible with 3rd spacing of fluid, potentially related to the patient's hypoalbuminemia. 4. Distal hydroureter bilaterally.   Electronically Signed   By: Herbie Baltimore   On: 06/25/2013 13:22    Scheduled Meds: . cefTRIAXone (ROCEPHIN)  IV  1 g Intravenous Q24H  . Chlorhexidine Gluconate Cloth  6 each Topical Q0600  . enoxaparin (LOVENOX) injection  30 mg Subcutaneous Q24H  . insulin aspart  0-5 Units Subcutaneous QHS  . insulin aspart  0-9 Units Subcutaneous TID WC  . mupirocin ointment  1 application Nasal BID  . simvastatin  20 mg Oral Daily  . sodium chloride  3 mL Intravenous Q12H  . vancomycin  750 mg Intravenous Q24H   Continuous Infusions: . sodium chloride 125 mL/hr at 06/26/13 0106    Principal Problem:   Sepsis Active Problems:   UTI (lower urinary tract infection)   Ureteral obstruction, left   Acute renal failure    Time spent: 35 minutes.     Latesia Norrington  [facility] Pager [phone]. If 7PM-7AM, please contact night-coverage at [email or web address], password [facility] 06/26/2013, 7:39 AM  LOS: 1 day

## 2013-06-27 DIAGNOSIS — D649 Anemia, unspecified: Secondary | ICD-10-CM

## 2013-06-27 DIAGNOSIS — E46 Unspecified protein-calorie malnutrition: Secondary | ICD-10-CM

## 2013-06-27 DIAGNOSIS — N390 Urinary tract infection, site not specified: Secondary | ICD-10-CM

## 2013-06-27 DIAGNOSIS — N200 Calculus of kidney: Secondary | ICD-10-CM

## 2013-06-27 DIAGNOSIS — M48061 Spinal stenosis, lumbar region without neurogenic claudication: Secondary | ICD-10-CM

## 2013-06-27 DIAGNOSIS — A4901 Methicillin susceptible Staphylococcus aureus infection, unspecified site: Secondary | ICD-10-CM

## 2013-06-27 LAB — GLUCOSE, CAPILLARY
Glucose-Capillary: 271 mg/dL — ABNORMAL HIGH (ref 70–99)
Glucose-Capillary: 200 mg/dL — ABNORMAL HIGH (ref 70–99)
Glucose-Capillary: 356 mg/dL — ABNORMAL HIGH (ref 70–99)

## 2013-06-27 LAB — BASIC METABOLIC PANEL
Chloride: 103 mEq/L (ref 96–112)
GFR calc Af Amer: 68 mL/min — ABNORMAL LOW (ref >90)
GFR calc non Af Amer: 59 mL/min — ABNORMAL LOW (ref >90)
Potassium: 5.1 mEq/L (ref 3.5–5.1)
Sodium: 132 mEq/L — ABNORMAL LOW (ref 135–145)

## 2013-06-27 LAB — TYPE AND SCREEN
ABO/RH(D): B POS
Antibody Screen: NEGATIVE

## 2013-06-27 LAB — CBC
HCT: 24 % — ABNORMAL LOW (ref 39.0–52.0)
MCHC: 31.7 g/dL (ref 30.0–36.0)
Platelets: 272 10*3/uL (ref 150–400)
RDW: 16.1 % — ABNORMAL HIGH (ref 11.5–15.5)
WBC: 8.2 10*3/uL (ref 4.0–10.5)

## 2013-06-27 LAB — URINE CULTURE: Colony Count: >=100000

## 2013-06-27 MED ORDER — VANCOMYCIN HCL IN DEXTROSE 1-5 GM/200ML-% IV SOLN
1000.0000 mg | INTRAVENOUS | Status: DC
  Administered 2013-06-27: 1000 mg via INTRAVENOUS
  Filled 2013-06-27: qty 200

## 2013-06-27 MED ORDER — POLYETHYLENE GLYCOL 3350 17 G PO PACK
17.0000 g | PACK | Freq: Two times a day (BID) | ORAL | Status: DC
  Administered 2013-06-27 – 2013-07-04 (×7): 17 g via ORAL
  Filled 2013-06-27 (×16): qty 1

## 2013-06-27 MED ORDER — INSULIN ASPART 100 UNIT/ML ~~LOC~~ SOLN
12.0000 [IU] | Freq: Once | SUBCUTANEOUS | Status: AC
  Administered 2013-06-27: 12 [IU] via SUBCUTANEOUS

## 2013-06-27 MED ORDER — SENNOSIDES-DOCUSATE SODIUM 8.6-50 MG PO TABS
1.0000 | ORAL_TABLET | Freq: Two times a day (BID) | ORAL | Status: DC
  Administered 2013-06-27 – 2013-07-04 (×6): 1 via ORAL
  Filled 2013-06-27 (×7): qty 1

## 2013-06-27 MED ORDER — CEFAZOLIN SODIUM 1-5 GM-% IV SOLN
1.0000 g | Freq: Three times a day (TID) | INTRAVENOUS | Status: DC
  Administered 2013-06-27 – 2013-07-04 (×21): 1 g via INTRAVENOUS
  Filled 2013-06-27 (×26): qty 50
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
## 2013-06-27 NOTE — Progress Notes (Signed)
Patient ID: LAQUINCY EASTRIDGE, male   DOB: [birth year], 67 y.o.   MRN: [id number]    Subjective: Mr. Wingerter continues to have pain in the mid low back that is worse with movement.   He is voiding well without complaints and has no flank pain.   His renal function remains stable and his Hgb is up post transfusion.  ROS: Negative except as above.   He has no nausea or hematuria.   Objective: Vital signs in last 24 hours: Temp:  [98 F (36.7 C)-98.7 F (37.1 C)] 98 F (36.7 C) (09/09 0457) Pulse Rate:  [66-88] 66 (09/09 0457) Resp:  [0-26] 10 (09/09 0457) BP: (99-138)/(60-83) 119/68 mmHg (09/09 0457) SpO2:  [94 %-100 %] 99 % (09/09 0457) Weight:  [50 kg (110 lb 3.7 oz)] 50 kg (110 lb 3.7 oz) (09/09 0457)  Intake/Output from previous day: 09/08 0701 - 09/09 0700 In: 4352.5 [P.O.:1440; I.V.:2500; Blood:362.5; IV Piggyback:50] Out: 1675 [Urine:1675] Intake/Output this shift:    General appearance: alert and no distress  Lab Results:   Recent Labs  06/26/13 0525 06/27/13 0452  WBC 9.3 8.2  HGB 6.6* 7.6*  HCT 21.0* 24.0*  PLT 301 272   BMET  Recent Labs  06/26/13 0525 06/27/13 0452  NA 132* 132*  K 4.7 5.1  CL 103 103  CO2 23 23  GLUCOSE 76 176*  BUN 27* 25*  CREATININE 1.31 1.23  CALCIUM 8.5 8.4   PT/INR No results found for this basename: LABPROT, INR,  in the last 72 hours ABG No results found for this basename: PHART, PCO2, PO2, HCO3,  in the last 72 hours  Studies/Results: Mr Lumbar Spine Wo Contrast  06/25/2013   CLINICAL DATA:  Low back pain. Diabetes. Hypertension.  EXAM: MRI LUMBAR SPINE WITHOUT CONTRAST; MR SACRUM WITHOUT CONTRAST  TECHNIQUE: Multiplanar, multisequence MR imaging was performed. No intravenous contrast was administered.  COMPARISON:  CT scan dated 06/25/2013  FINDINGS: MRI LUMBAR SPINE:  Reduced generalized marrow signal in the lumbar spine on T1 weighted images. Low under during recovery weighted diffuse marrow signal observed, although some of  this may be attributable to chemical shift artifact given the exaggerated chemical shift artifact on the T2 axial images.  The lowest lumbar type non-rib-bearing vertebra is labeled as L5. The conus medullaris appears normal. Conus level: L1.  A hemangioma is present in the S1 vertebral body.  Paraspinal edema noted. Degenerative endplate findings noted at L3-4 and L5-S1 with loss of disc height particularly at L3-4. Additional findings at individual levels are as follows: L1-2: Unremarkable.  L2-3: No impingement. Disk bulge abuts but does not displace the left L3 nerve in the lateral extraforaminal space.  L3-4: Moderate right and mild left foraminal stenosis with mild bilateral subarticular lateral recess stenosis and moderate central stenosis secondary to posterior osseous ridging and facet arthropathy. Spurring deviates the right L3 nerve in the lateral extraforaminal space.  L4-5: Prominent left foraminal stenosis noted with moderate central stenosis, moderate left and mild right subarticular lateral recess stenosis secondary to broad left paracentral on lateral recess disc protrusion, facet arthropathy, and intervertebral spurring.  L5-S1: Moderate left foraminal stenosis due to intervertebral spurring. Facet spurring noted on the left.  MRI SACRUM:  Small hemangioma in the S1 vertebra noted. Trace right hip effusion. There is abnormal low grade edema tracking along the psoas and iliacus muscles bilaterally in a symmetric fashion.  Low T1 signal intensity is present in the S3-4 level of the sacrum with a  small central focus of higher inversion recovery weighted signal representing a small sacral lesion which is new compared to the 2008 examination is ; this lesion has intermediate T1 signal characteristics.  Subtle edema tracks along the piriformis musculature and along the gluteal tissue planes.  No definite generalized bony sclerosis on the CT scan from 06/25/2013.  Mild bilateral distal hydroureter.   IMPRESSION: LUMBAR SPINE:  1. Lumbar spondylosis and degenerative disc disease causing prominent impingement at L4-5 and moderate impingement at L3-4 and L5-S1 as detailed above. 2. Reduced T1 and T2 marrow signal. Although this can be countered in setting such as myelofibrosis and mastocytosis, as well as hemosiderosis, we do not demonstrate prominent marrow sclerosis on the CT images. Infiltrative marrow disorder is a differential diagnostic consideration. Red marrow reconversion in light of the patient's anemia is a possibility, but typically the would have high T2 signal rather than low T2 signal. Bone marrow biopsy could be utilized for further characterization of the marrow. SACRUM:  1. Small nonspecific lesion in the S3 vertebral body with adjacent low T1 signal. This lesion has faintly elevated T2 signal. No significant extraosseous component. This may be a small chondroid or vascular lesion but was not visible in 2008. Localized amyloid deposition could have a similar appearance. This may warrant followup imaging to exclude progression/malignancy. 2. Small hemangioma in the S1 vertebral body. 3. Diffuse edema tracking along paraspinal, iliopsoas, and pelvic muscle groups compatible with 3rd spacing of fluid, potentially related to the patient's hypoalbuminemia. 4. Distal hydroureter bilaterally.   Electronically Signed   By: Herbie Baltimore   On: 06/25/2013 13:22   Mr Sacrum/si Joints Wo Contrast  06/25/2013   CLINICAL DATA:  Low back pain. Diabetes. Hypertension.  EXAM: MRI LUMBAR SPINE WITHOUT CONTRAST; MR SACRUM WITHOUT CONTRAST  TECHNIQUE: Multiplanar, multisequence MR imaging was performed. No intravenous contrast was administered.  COMPARISON:  CT scan dated 06/25/2013  FINDINGS: MRI LUMBAR SPINE:  Reduced generalized marrow signal in the lumbar spine on T1 weighted images. Low under during recovery weighted diffuse marrow signal observed, although some of this may be attributable to chemical shift  artifact given the exaggerated chemical shift artifact on the T2 axial images.  The lowest lumbar type non-rib-bearing vertebra is labeled as L5. The conus medullaris appears normal. Conus level: L1.  A hemangioma is present in the S1 vertebral body.  Paraspinal edema noted. Degenerative endplate findings noted at L3-4 and L5-S1 with loss of disc height particularly at L3-4. Additional findings at individual levels are as follows: L1-2: Unremarkable.  L2-3: No impingement. Disk bulge abuts but does not displace the left L3 nerve in the lateral extraforaminal space.  L3-4: Moderate right and mild left foraminal stenosis with mild bilateral subarticular lateral recess stenosis and moderate central stenosis secondary to posterior osseous ridging and facet arthropathy. Spurring deviates the right L3 nerve in the lateral extraforaminal space.  L4-5: Prominent left foraminal stenosis noted with moderate central stenosis, moderate left and mild right subarticular lateral recess stenosis secondary to broad left paracentral on lateral recess disc protrusion, facet arthropathy, and intervertebral spurring.  L5-S1: Moderate left foraminal stenosis due to intervertebral spurring. Facet spurring noted on the left.  MRI SACRUM:  Small hemangioma in the S1 vertebra noted. Trace right hip effusion. There is abnormal low grade edema tracking along the psoas and iliacus muscles bilaterally in a symmetric fashion.  Low T1 signal intensity is present in the S3-4 level of the sacrum with a small central focus of higher  inversion recovery weighted signal representing a small sacral lesion which is new compared to the 2008 examination is ; this lesion has intermediate T1 signal characteristics.  Subtle edema tracks along the piriformis musculature and along the gluteal tissue planes.  No definite generalized bony sclerosis on the CT scan from 06/25/2013.  Mild bilateral distal hydroureter.  IMPRESSION: LUMBAR SPINE:  1. Lumbar spondylosis  and degenerative disc disease causing prominent impingement at L4-5 and moderate impingement at L3-4 and L5-S1 as detailed above. 2. Reduced T1 and T2 marrow signal. Although this can be countered in setting such as myelofibrosis and mastocytosis, as well as hemosiderosis, we do not demonstrate prominent marrow sclerosis on the CT images. Infiltrative marrow disorder is a differential diagnostic consideration. Red marrow reconversion in light of the patient's anemia is a possibility, but typically the would have high T2 signal rather than low T2 signal. Bone marrow biopsy could be utilized for further characterization of the marrow. SACRUM:  1. Small nonspecific lesion in the S3 vertebral body with adjacent low T1 signal. This lesion has faintly elevated T2 signal. No significant extraosseous component. This may be a small chondroid or vascular lesion but was not visible in 2008. Localized amyloid deposition could have a similar appearance. This may warrant followup imaging to exclude progression/malignancy. 2. Small hemangioma in the S1 vertebral body. 3. Diffuse edema tracking along paraspinal, iliopsoas, and pelvic muscle groups compatible with 3rd spacing of fluid, potentially related to the patient's hypoalbuminemia. 4. Distal hydroureter bilaterally.   Electronically Signed   By: Herbie Baltimore   On: 06/25/2013 13:22    Anti-infectives: Anti-infectives   Start     Dose/Rate Route Frequency Ordered Stop   06/26/13 1000  vancomycin (VANCOCIN) IVPB 750 mg/150 ml premix  Status:  Discontinued     750 mg 150 mL/hr over 60 Minutes Intravenous Every 24 hours 06/25/13 0829 06/25/13 0830   06/26/13 0600  cefTRIAXone (ROCEPHIN) 1 g in dextrose 5 % 50 mL IVPB     1 g 100 mL/hr over 30 Minutes Intravenous Every 24 hours 06/25/13 0817     06/25/13 0900  vancomycin (VANCOCIN) IVPB 750 mg/150 ml premix  Status:  Discontinued     750 mg 150 mL/hr over 60 Minutes Intravenous Every 24 hours 06/25/13 0830  06/26/13 0758   06/25/13 0830  vancomycin (VANCOCIN) IVPB 1000 mg/200 mL premix  Status:  Discontinued     1,000 mg 200 mL/hr over 60 Minutes Intravenous  Once 06/25/13 0829 06/25/13 0830   06/25/13 0800  cefTRIAXone (ROCEPHIN) 1 g in dextrose 5 % 50 mL IVPB  Status:  Discontinued     1 g 100 mL/hr over 30 Minutes Intravenous Every 24 hours 06/25/13 0746 06/25/13 0817   06/25/13 0545  cefTRIAXone (ROCEPHIN) 1 g in dextrose 5 % 50 mL IVPB     1 g 100 mL/hr over 30 Minutes Intravenous  Once 06/25/13 0532 06/25/13 1191      Current Facility-Administered Medications  Medication Dose Route Frequency Provider Last Rate Last Dose  . 0.9 %  sodium chloride infusion   Intravenous Continuous Belkys A Regalado, MD 125 mL/hr at 06/27/13 0636    . acetaminophen (TYLENOL) tablet 650 mg  650 mg Oral Q4H PRN Belkys A Regalado, MD      . cefTRIAXone (ROCEPHIN) 1 g in dextrose 5 % 50 mL IVPB  1 g Intravenous Q24H Shanna Cisco, MD   1 g at 06/27/13 4782  . Chlorhexidine Gluconate Cloth 2 %  PADS 6 each  6 each Topical Q0600 Alba Cory, MD   6 each at 06/27/13 0634  . enoxaparin (LOVENOX) injection 40 mg  40 mg Subcutaneous Daily Crystal Willows, [facility]      . feeding supplement (ENSURE COMPLETE) liquid 237 mL  237 mL Oral BID BM Belkys A Regalado, MD   237 mL at 06/26/13 1034  . feeding supplement (ENSURE COMPLETE) liquid 237 mL  237 mL Oral BID BM Belkys A Regalado, MD   237 mL at 06/26/13 1443  . HYDROcodone-acetaminophen (NORCO/VICODIN) 5-325 MG per tablet 1-2 tablet  1-2 tablet Oral Q4H PRN Alba Cory, MD   1 tablet at 06/27/13 0634  . insulin aspart (novoLOG) injection 0-5 Units  0-5 Units Subcutaneous QHS Alba Cory, MD   3 Units at 06/26/13 2129  . insulin aspart (novoLOG) injection 0-9 Units  0-9 Units Subcutaneous TID WC Belkys A Regalado, MD   7 Units at 06/26/13 1654  . morphine 2 MG/ML injection 1 mg  1 mg Intravenous Q3H PRN Belkys A Regalado, MD   1 mg at  06/25/13 1115  . mupirocin ointment (BACTROBAN) 2 % 1 application  1 application Nasal BID Alba Cory, MD   1 application at 06/26/13 2122  . ondansetron (ZOFRAN) tablet 4 mg  4 mg Oral Q6H PRN Belkys A Regalado, MD       Or  . ondansetron (ZOFRAN) injection 4 mg  4 mg Intravenous Q6H PRN Belkys A Regalado, MD      . simvastatin (ZOCOR) tablet 20 mg  20 mg Oral Daily Belkys A Regalado, MD   20 mg at 06/26/13 1034  . sodium chloride 0.9 % injection 3 mL  3 mL Intravenous Q12H Belkys A Regalado, MD   3 mL at 06/26/13 2122   Note from Dr. Newell Coral reviewed.  Labs reviewed.   Assessment: He continues to have pain that is non-urologic. His renal function is stable.    Plan: I have him posted on 9/23 for cystoscopy with RTG's and prostate [place] with biopsy at [facility].   I have reviewed the risks.  He can be brought in as outpatient for that procedure.     I still see no notes from [facility].   Please reorder.      LOS: 2 days    Anner Crete 06/27/2013

## 2013-06-27 NOTE — Progress Notes (Signed)
[facility] PROGRESS NOTE  Miguel Leblanc WJX:[id number] DOB: [birth year] DOA: 06/25/2013 PCP: Pcp Not In System  Assessment/Plan: 67 year old with PMH significant for Diabetes, HTN who presents to ED complaining of worsening lower back pain. Back pain started day prior to admission, 9/10 in intensity, constant. He has been passing some black small stone in his urine for last 3 weeks, but no pain. He presents with sepsis, SBP at 87, Lactic acid at 2.5. CT abdomen/pelvis showed Punctate sized stone in the distal left ureter with moderate proximal obstruction. There is diffuse bladder wall thickening which may represent cystitis. He was evaluate by Dr Wilson Singer who felt that patient 's back pain was not secondary to urologic finding. MRI lumbar and sacral spine was ordered. MRI show spondylosis, degenerative disc disease causing prominent impingement at L4-5 and moderate impingement at L3-4 and L5-S1 as detailed above. Dr Newell Coral  Evaluated the patient. He doesn't think that patient 's back pain is related to degenerative changes of the spine. He think that the underlying process resulting in the change in the appearance of his bone marrow on MRI may be the source of his back discomfort. No  role for neurosurgical intervention at this time.  -Regarding left ureter with moderate proximal obstruction, and nodular prostate gland Dr Wilson Singer is planning on 9/23  cystoscopy with RTG's and prostate [place] with biopsy at [facility], outpatient procedure.   -We are waiting records from [facility] to determine next step in evaluation of anemia (next study and need for hematology consultation.   -UA grew staph Aureus, unusual bacteria in urine. Blood culture no growth to date. I have ask ID to evaluate.   1-Sepsis: patient presents with hypotension, mild lactic acidosis, Tachycardia. Source of infection ? Pyelonephritis. UA with 21 to 50 WBC.  -Received 2 days of IV ceftriaxone.  -Continue  with fluids, blood culture no growth to date, repeat lactic acid decrease to 0.7. Urine culture grew staph.   -Will start Vancomycin.  -ID consulted.   2-Stone in the distal left ureter with moderate proximal obstruction// Pyelonephritis:  -Continue with IV fluids, urine culture grew Staph. . Urology Dr Wilson Singer consulted. -Patient  need Cystoscopy with retrograde pyelogram and prostate US biopsy. Dr Wilson Singer will arrange follow up. PAS at 0.33 -will start Vancomycin, will follow ID recommendation.   3-Acute renal failure.; Cr peak to 1.6. Likely obstructive uropathy secondary to ureter stone. Continue with IV fluids, IV antibiotics, Urology consulted. Cr has decrease to 1.3.   4-Diabetes: Had episodes of hypoglycemia. Holding lantus. Not eating enough. SSI. If blood sugar continue to increase, will need to resume lantus.   5-Anemia: no evidence of bleeding. He will need further evaluation. Anemia panel ferritin 1904, folate 12, B 12 447. Transfuse 1 units PRBC. Anemia panel consistent with anemia of chronic diseases. Reduced T 1 and T 2 marrow signal.  -Waiting for records to determine next step in evaluation.   -S/P 1 unit PRBC 9-8.   6-Mild Hyponatremia: in setting of dehydration, renal failure. Continue with IV fluids.   7-Chronic Right LE ulcer: no significant drainage, wound care consulted.   8-Back pain? MRI with : Lumbar spondylosis and degenerative disc disease causing prominent impingement at L4-5 and moderate impingement at L3-4 and  L5-S 1. Small nonspecific lesion in the S3 vertebral body with adjacent low T1 signal This may be a small chondroid or vascular lesion but was not visible in 2008. Localized amyloid deposition could have a  similar appearance. This may warrant followup imaging to exclude progression/malignancy. Small hemangioma in the S1 vertebral body.  -Neurosurgery consulted who recommend  Pain management.  -Urine grew staph, blood culture no growth to date, no mention of  diskitis on MRI. ID consulted.      Code Status: Full Code.  Family Communication: Care discussed with Patient.  Disposition Plan:    Consultants: Dr Wilson Singer  Procedures:  none  Antibiotics:  Ceftriaxone 9-8  Vancomycin 9-8  HPI/Subjective: feeling better. Still complaining of back pain.   Objective: Filed Vitals:   06/27/13 0457  BP: 119/68  Pulse: 66  Temp: 98 F (36.7 C)  Resp: 10    Intake/Output Summary (Last 24 hours) at 06/27/13 0735 Last data filed at 06/27/13 0700  Gross per 24 hour  Intake   4465 ml  Output   1675 ml  Net   2790 ml   Filed Weights   06/25/13 0712 06/26/13 0419 06/27/13 0457  Weight: 49.896 kg (110 lb) 50.3 kg (110 lb 14.3 oz) 50 kg (110 lb 3.7 oz)    Exam:   General:  No distress.   Cardiovascular: S 1, S 2 RRR  Respiratory: CTA  Abdomen: BS present, soft, nt  Musculoskeletal: no edema.   Data Reviewed: Basic Metabolic Panel:  Recent Labs Lab 06/25/13 0437 06/25/13 1100 06/26/13 0525 06/27/13 0452  NA 133*  --  132* 132*  K 4.3  --  4.7 5.1  CL 98  --  103 103  CO2 24  --  23 23  GLUCOSE 127*  --  76 176*  BUN 39*  --  27* 25*  CREATININE 1.64* 1.34 1.31 1.23  CALCIUM 8.7  --  8.5 8.4   Liver Function Tests:  Recent Labs Lab 06/25/13 0437 06/26/13 0525  AST 12 11  ALT 6 5  ALKPHOS 105 110  BILITOT 0.1* 0.2*  PROT 6.9 6.3  ALBUMIN 1.9* 1.6*   No results found for this basename: LIPASE, AMYLASE,  in the last 168 hours No results found for this basename: AMMONIA,  in the last 168 hours CBC:  Recent Labs Lab 06/25/13 0437 06/25/13 1100 06/26/13 0525 06/27/13 0452  WBC 10.5 10.6* 9.3 8.2  NEUTROABS 9.1*  --   --   --   HGB 7.0* 8.2* 6.6* 7.6*  HCT 22.4* 25.9* 21.0* 24.0*  MCV 80.0 79.9 80.8 82.2  PLT 305 394 301 272   Cardiac Enzymes: No results found for this basename: CKTOTAL, CKMB, CKMBINDEX, TROPONINI,  in the last 168 hours BNP (last 3 results) No results found for this basename:  PROBNP,  in the last 8760 hours CBG:  Recent Labs Lab 06/26/13 0741 06/26/13 0833 06/26/13 1217 06/26/13 1558 06/26/13 2112  GLUCAP 62* 92 149* 301* 271*    Recent Results (from the past 240 hour(s))  URINE CULTURE     Status: None   Collection Time    06/25/13  5:02 AM      Result Value Range Status   Specimen Description URINE, CLEAN CATCH   Final   Special Requests NONE   Final   Culture  Setup Time     Final   Value: 06/25/2013 06:00     Performed at [facility] Count     Final   Value: >=100,000 COLONIES/ML     Performed at [facility]   Culture     Final   Value: STAPHYLOCOCCUS AUREUS     Note:  RIFAMPIN AND GENTAMICIN SHOULD NOT BE USED AS SINGLE DRUGS FOR TREATMENT OF STAPH INFECTIONS.     Performed at [facility]   Report Status 06/27/2013 FINAL   Final   Organism ID, Bacteria STAPHYLOCOCCUS AUREUS   Final  CULTURE, BLOOD (ROUTINE X 2)     Status: None   Collection Time    06/25/13  8:25 AM      Result Value Range Status   Specimen Description BLOOD RIGHT ARM   Final   Special Requests BOTTLES DRAWN AEROBIC AND ANAEROBIC 10CC EACH   Final   Culture  Setup Time     Final   Value: 06/25/2013 16:16     Performed at [facility]   Culture     Final   Value:        BLOOD CULTURE RECEIVED NO GROWTH TO DATE CULTURE WILL BE HELD FOR 5 DAYS BEFORE ISSUING A FINAL NEGATIVE REPORT     Performed at [facility]   Report Status PENDING   Incomplete  CULTURE, BLOOD (ROUTINE X 2)     Status: None   Collection Time    06/25/13  8:40 AM      Result Value Range Status   Specimen Description BLOOD RIGHT HAND   Final   Special Requests BOTTLES DRAWN AEROBIC AND ANAEROBIC 10CC EACH   Final   Culture  Setup Time     Final   Value: 06/25/2013 16:16     Performed at [facility]   Culture     Final   Value:        BLOOD CULTURE RECEIVED NO GROWTH TO DATE CULTURE WILL BE HELD FOR 5 DAYS BEFORE ISSUING A FINAL NEGATIVE  REPORT     Performed at [facility]   Report Status PENDING   Incomplete  MRSA PCR SCREENING     Status: Abnormal   Collection Time    06/25/13 10:32 AM      Result Value Range Status   MRSA by PCR POSITIVE (*) NEGATIVE Final   Comment:            The GeneXpert MRSA Assay (FDA     approved for NASAL specimens     only), is one component of a     comprehensive MRSA colonization     surveillance program. It is not     intended to diagnose MRSA     infection nor to guide or     monitor treatment for     MRSA infections.     RESULT CALLED TO, READ BACK BY AND VERIFIED WITH:     AGUIRRI,A RN 06/25/13 1304 WOOTEN,K     Studies: Mr Lumbar Spine Wo Contrast  06/25/2013   CLINICAL DATA:  Low back pain. Diabetes. Hypertension.  EXAM: MRI LUMBAR SPINE WITHOUT CONTRAST; MR SACRUM WITHOUT CONTRAST  TECHNIQUE: Multiplanar, multisequence MR imaging was performed. No intravenous contrast was administered.  COMPARISON:  CT scan dated 06/25/2013  FINDINGS: MRI LUMBAR SPINE:  Reduced generalized marrow signal in the lumbar spine on T1 weighted images. Low under during recovery weighted diffuse marrow signal observed, although some of this may be attributable to chemical shift artifact given the exaggerated chemical shift artifact on the T2 axial images.  The lowest lumbar type non-rib-bearing vertebra is labeled as L5. The conus medullaris appears normal. Conus level: L1.  A hemangioma is present in the S1 vertebral body.  Paraspinal edema noted. Degenerative endplate findings noted at L3-4  and L5-S1 with loss of disc height particularly at L3-4. Additional findings at individual levels are as follows: L1-2: Unremarkable.  L2-3: No impingement. Disk bulge abuts but does not displace the left L3 nerve in the lateral extraforaminal space.  L3-4: Moderate right and mild left foraminal stenosis with mild bilateral subarticular lateral recess stenosis and moderate central stenosis secondary to posterior  osseous ridging and facet arthropathy. Spurring deviates the right L3 nerve in the lateral extraforaminal space.  L4-5: Prominent left foraminal stenosis noted with moderate central stenosis, moderate left and mild right subarticular lateral recess stenosis secondary to broad left paracentral on lateral recess disc protrusion, facet arthropathy, and intervertebral spurring.  L5-S1: Moderate left foraminal stenosis due to intervertebral spurring. Facet spurring noted on the left.  MRI SACRUM:  Small hemangioma in the S1 vertebra noted. Trace right hip effusion. There is abnormal low grade edema tracking along the psoas and iliacus muscles bilaterally in a symmetric fashion.  Low T1 signal intensity is present in the S3-4 level of the sacrum with a small central focus of higher inversion recovery weighted signal representing a small sacral lesion which is new compared to the 2008 examination is ; this lesion has intermediate T1 signal characteristics.  Subtle edema tracks along the piriformis musculature and along the gluteal tissue planes.  No definite generalized bony sclerosis on the CT scan from 06/25/2013.  Mild bilateral distal hydroureter.  IMPRESSION: LUMBAR SPINE:  1. Lumbar spondylosis and degenerative disc disease causing prominent impingement at L4-5 and moderate impingement at L3-4 and L5-S1 as detailed above. 2. Reduced T1 and T2 marrow signal. Although this can be countered in setting such as myelofibrosis and mastocytosis, as well as hemosiderosis, we do not demonstrate prominent marrow sclerosis on the CT images. Infiltrative marrow disorder is a differential diagnostic consideration. Red marrow reconversion in light of the patient's anemia is a possibility, but typically the would have high T2 signal rather than low T2 signal. Bone marrow biopsy could be utilized for further characterization of the marrow. SACRUM:  1. Small nonspecific lesion in the S3 vertebral body with adjacent low T1 signal. This  lesion has faintly elevated T2 signal. No significant extraosseous component. This may be a small chondroid or vascular lesion but was not visible in 2008. Localized amyloid deposition could have a similar appearance. This may warrant followup imaging to exclude progression/malignancy. 2. Small hemangioma in the S1 vertebral body. 3. Diffuse edema tracking along paraspinal, iliopsoas, and pelvic muscle groups compatible with 3rd spacing of fluid, potentially related to the patient's hypoalbuminemia. 4. Distal hydroureter bilaterally.   Electronically Signed   By: Herbie Baltimore   On: 06/25/2013 13:22   Mr Sacrum/si Joints Wo Contrast  06/25/2013   CLINICAL DATA:  Low back pain. Diabetes. Hypertension.  EXAM: MRI LUMBAR SPINE WITHOUT CONTRAST; MR SACRUM WITHOUT CONTRAST  TECHNIQUE: Multiplanar, multisequence MR imaging was performed. No intravenous contrast was administered.  COMPARISON:  CT scan dated 06/25/2013  FINDINGS: MRI LUMBAR SPINE:  Reduced generalized marrow signal in the lumbar spine on T1 weighted images. Low under during recovery weighted diffuse marrow signal observed, although some of this may be attributable to chemical shift artifact given the exaggerated chemical shift artifact on the T2 axial images.  The lowest lumbar type non-rib-bearing vertebra is labeled as L5. The conus medullaris appears normal. Conus level: L1.  A hemangioma is present in the S1 vertebral body.  Paraspinal edema noted. Degenerative endplate findings noted at L3-4 and L5-S1 with loss of  disc height particularly at L3-4. Additional findings at individual levels are as follows: L1-2: Unremarkable.  L2-3: No impingement. Disk bulge abuts but does not displace the left L3 nerve in the lateral extraforaminal space.  L3-4: Moderate right and mild left foraminal stenosis with mild bilateral subarticular lateral recess stenosis and moderate central stenosis secondary to posterior osseous ridging and facet arthropathy. Spurring  deviates the right L3 nerve in the lateral extraforaminal space.  L4-5: Prominent left foraminal stenosis noted with moderate central stenosis, moderate left and mild right subarticular lateral recess stenosis secondary to broad left paracentral on lateral recess disc protrusion, facet arthropathy, and intervertebral spurring.  L5-S1: Moderate left foraminal stenosis due to intervertebral spurring. Facet spurring noted on the left.  MRI SACRUM:  Small hemangioma in the S1 vertebra noted. Trace right hip effusion. There is abnormal low grade edema tracking along the psoas and iliacus muscles bilaterally in a symmetric fashion.  Low T1 signal intensity is present in the S3-4 level of the sacrum with a small central focus of higher inversion recovery weighted signal representing a small sacral lesion which is new compared to the 2008 examination is ; this lesion has intermediate T1 signal characteristics.  Subtle edema tracks along the piriformis musculature and along the gluteal tissue planes.  No definite generalized bony sclerosis on the CT scan from 06/25/2013.  Mild bilateral distal hydroureter.  IMPRESSION: LUMBAR SPINE:  1. Lumbar spondylosis and degenerative disc disease causing prominent impingement at L4-5 and moderate impingement at L3-4 and L5-S1 as detailed above. 2. Reduced T1 and T2 marrow signal. Although this can be countered in setting such as myelofibrosis and mastocytosis, as well as hemosiderosis, we do not demonstrate prominent marrow sclerosis on the CT images. Infiltrative marrow disorder is a differential diagnostic consideration. Red marrow reconversion in light of the patient's anemia is a possibility, but typically the would have high T2 signal rather than low T2 signal. Bone marrow biopsy could be utilized for further characterization of the marrow. SACRUM:  1. Small nonspecific lesion in the S3 vertebral body with adjacent low T1 signal. This lesion has faintly elevated T2 signal. No  significant extraosseous component. This may be a small chondroid or vascular lesion but was not visible in 2008. Localized amyloid deposition could have a similar appearance. This may warrant followup imaging to exclude progression/malignancy. 2. Small hemangioma in the S1 vertebral body. 3. Diffuse edema tracking along paraspinal, iliopsoas, and pelvic muscle groups compatible with 3rd spacing of fluid, potentially related to the patient's hypoalbuminemia. 4. Distal hydroureter bilaterally.   Electronically Signed   By: Herbie Baltimore   On: 06/25/2013 13:22    Scheduled Meds: . cefTRIAXone (ROCEPHIN)  IV  1 g Intravenous Q24H  . Chlorhexidine Gluconate Cloth  6 each Topical Q0600  . enoxaparin (LOVENOX) injection  40 mg Subcutaneous Daily  . feeding supplement  237 mL Oral BID BM  . feeding supplement  237 mL Oral BID BM  . insulin aspart  0-5 Units Subcutaneous QHS  . insulin aspart  0-9 Units Subcutaneous TID WC  . mupirocin ointment  1 application Nasal BID  . simvastatin  20 mg Oral Daily  . sodium chloride  3 mL Intravenous Q12H   Continuous Infusions: . sodium chloride 125 mL/hr at 06/27/13 0636    Principal Problem:   Sepsis Active Problems:   UTI (lower urinary tract infection)   Ureteral obstruction, left   Acute renal failure    Time spent: 25 minutes.  Dequita Schleicher  [facility] Pager [phone]. If 7PM-7AM, please contact night-coverage at [email or web address], password [facility] 06/27/2013, 7:35 AM  LOS: 2 days

## 2013-06-27 NOTE — Progress Notes (Signed)
ANTIBIOTIC CONSULT NOTE - FOLLOW UP  Pharmacy Consult for Vancomycin Indication: Sepsis, Pyelo  No Known Allergies  Patient Measurements: Height: 5\' 4"  (162.6 cm) Weight: 110 lb 3.7 oz (50 kg) IBW/kg (Calculated) : 59.2 Adjusted Body Weight:   Vital Signs: Temp: 97.6 F (36.4 C) (09/09 1200) Temp src: Oral (09/09 1200) BP: 98/59 mmHg (09/09 1200) Pulse Rate: 82 (09/09 1200) Intake/Output from previous day: 09/08 0701 - 09/09 0700 In: 4477.5 [P.O.:1440; I.V.:2625; Blood:362.5; IV Piggyback:50] Out: 1675 [Urine:1675] Intake/Output from this shift: Total I/O In: 440.4 [I.V.:440.4] Out: 75 [Urine:75]  Labs:  Recent Labs  06/25/13 1100 06/26/13 0525 06/27/13 0452  WBC 10.6* 9.3 8.2  HGB 8.2* 6.6* 7.6*  PLT 394 301 272  CREATININE 1.34 1.31 1.23   Estimated Creatinine Clearance: 41.2 ml/min (by C-G formula based on Cr of 1.23). No results found for this basename: VANCOTROUGH, Leodis Binet, VANCORANDOM, GENTTROUGH, GENTPEAK, GENTRANDOM, TOBRATROUGH, TOBRAPEAK, TOBRARND, AMIKACINPEAK, AMIKACINTROU, AMIKACIN,  in the last 72 hours   Microbiology: Recent Results (from the past 720 hour(s))  URINE CULTURE     Status: None   Collection Time    06/25/13  5:02 AM      Result Value Range Status   Specimen Description URINE, CLEAN CATCH   Final   Special Requests NONE   Final   Culture  Setup Time     Final   Value: 06/25/2013 06:00     Performed at [facility] Count     Final   Value: >=100,000 COLONIES/ML     Performed at [facility]   Culture     Final   Value: STAPHYLOCOCCUS AUREUS     Note: RIFAMPIN AND GENTAMICIN SHOULD NOT BE USED AS SINGLE DRUGS FOR TREATMENT OF STAPH INFECTIONS.     Performed at [facility]   Report Status 06/27/2013 FINAL   Final   Organism ID, Bacteria STAPHYLOCOCCUS AUREUS   Final  CULTURE, BLOOD (ROUTINE X 2)     Status: None   Collection Time    06/25/13  8:25 AM      Result Value Range Status    Specimen Description BLOOD RIGHT ARM   Final   Special Requests BOTTLES DRAWN AEROBIC AND ANAEROBIC 10CC EACH   Final   Culture  Setup Time     Final   Value: 06/25/2013 16:16     Performed at [facility]   Culture     Final   Value:        BLOOD CULTURE RECEIVED NO GROWTH TO DATE CULTURE WILL BE HELD FOR 5 DAYS BEFORE ISSUING A FINAL NEGATIVE REPORT     Performed at [facility]   Report Status PENDING   Incomplete  CULTURE, BLOOD (ROUTINE X 2)     Status: None   Collection Time    06/25/13  8:40 AM      Result Value Range Status   Specimen Description BLOOD RIGHT HAND   Final   Special Requests BOTTLES DRAWN AEROBIC AND ANAEROBIC 10CC EACH   Final   Culture  Setup Time     Final   Value: 06/25/2013 16:16     Performed at [facility]   Culture     Final   Value:        BLOOD CULTURE RECEIVED NO GROWTH TO DATE CULTURE WILL BE HELD FOR 5 DAYS BEFORE ISSUING A FINAL NEGATIVE REPORT     Performed at [facility]  Report Status PENDING   Incomplete  MRSA PCR SCREENING     Status: Abnormal   Collection Time    06/25/13 10:32 AM      Result Value Range Status   MRSA by PCR POSITIVE (*) NEGATIVE Final   Comment:            The GeneXpert MRSA Assay (FDA     approved for NASAL specimens     only), is one component of a     comprehensive MRSA colonization     surveillance program. It is not     intended to diagnose MRSA     infection nor to guide or     monitor treatment for     MRSA infections.     RESULT CALLED TO, READ BACK BY AND VERIFIED WITH:     AGUIRRI,A RN 06/25/13 1304 WOOTEN,K    Anti-infectives   Start     Dose/Rate Route Frequency Ordered Stop   06/26/13 1000  vancomycin (VANCOCIN) IVPB 750 mg/150 ml premix  Status:  Discontinued     750 mg 150 mL/hr over 60 Minutes Intravenous Every 24 hours 06/25/13 0829 06/25/13 0830   06/26/13 0600  cefTRIAXone (ROCEPHIN) 1 g in dextrose 5 % 50 mL IVPB  Status:  Discontinued     1 g 100 mL/hr  over 30 Minutes Intravenous Every 24 hours 06/25/13 0817 06/27/13 1157   06/25/13 0900  vancomycin (VANCOCIN) IVPB 750 mg/150 ml premix  Status:  Discontinued     750 mg 150 mL/hr over 60 Minutes Intravenous Every 24 hours 06/25/13 0830 06/26/13 0758   06/25/13 0830  vancomycin (VANCOCIN) IVPB 1000 mg/200 mL premix  Status:  Discontinued     1,000 mg 200 mL/hr over 60 Minutes Intravenous  Once 06/25/13 0829 06/25/13 0830   06/25/13 0800  cefTRIAXone (ROCEPHIN) 1 g in dextrose 5 % 50 mL IVPB  Status:  Discontinued     1 g 100 mL/hr over 30 Minutes Intravenous Every 24 hours 06/25/13 0746 06/25/13 0817   06/25/13 0545  cefTRIAXone (ROCEPHIN) 1 g in dextrose 5 % 50 mL IVPB     1 g 100 mL/hr over 30 Minutes Intravenous  Once 06/25/13 0532 06/25/13 0634      Assessment: 67 YOM complaining of lower back pain x 2 weeks. Abdominal CT showed stone in the distal left ureter with moderate proximal obstruction, possible cystitis, and possible pyelonephritis. Pharmacy consulted to manage vancomycin for sepsis and pyelonephritis. Patient also has ARF as a result.  Anticoagulation: Lovenox 40mg /d. Hgb 6.6 (transfused 1 unit) to 7.6 today.   Infectious Disease: Rocephin D#3 for sepsis and pyelonephritis, LA was 2.58. Afebrile. WBC 8.2 down. Rocephin should cover MSSA UTI but not tested.  Vanc 9/7 , 9/9>> Rocephin 9/7 >>  9/7 BC x 2>>pending 9/7 UCx>>MSSA 9/7 MRSA PCR +  Cardiovascular: hx HTN, VSS, Zocor  Endocrinology: hx DM on SSI. CBGs 92-301 (highs may be due to treatment of hypoglycemia previously)  Gastrointestinal / Nutrition: LFTs WNL, albumin 1.9  Nephrology: hx kidney stones presented with several stones on 9/7 CT, causing obstruction, leading to ARF - SCr down to 1.23 but K up to 5.1, Na 132  Pulmonary: 96%  Hematology / Oncology: Hgb 7.6. Transfused 1 units PRBC 9/8.  PTA Medication Issues  Best Practices: LMWH,   Goal of Therapy:  Vancomycin trough level 10-15  mcg/ml  Plan:  - Vanco 1g IV q24h. Trough after 3-5 doses at steady state - Rocephin 1gm  IV Q24H as ordered   Kdyn Vonbehren S. Merilynn Finland, PharmD, BCPS Clinical Staff Pharmacist Pager [phone]  Misty Stanley Stillinger 06/27/2013,12:37 PM

## 2013-06-27 NOTE — Progress Notes (Signed)
Inpatient Diabetes Program Recommendations  AACE/ADA: New Consensus Statement on Inpatient Glycemic Control (2013)  Target Ranges:  Prepandial:   less than 140 mg/dL      Peak postprandial:   less than 180 mg/dL (1-2 hours)      Critically ill patients:  140 - 180 mg/dL  Hyperglycemia at mid-day and afternoon into 300's and 400's  Inpatient Diabetes Program Recommendations Insulin - Basal: Please add some basal Lantus .  Pt takes 20-30 units at home.  Thank you, Lenor Coffin, RN, CNS, Diabetes Coordinator [phone])

## 2013-06-27 NOTE — Progress Notes (Signed)
Pt CBG 415; Dr Sunnie Nielsen notifed; Dr Sunnie Nielsen ordered not to do lab verification; Verbal order obtained to given 12 Units of Novolog, will continue to monitor.
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
## 2013-06-28 DIAGNOSIS — E1129 Type 2 diabetes mellitus with other diabetic kidney complication: Secondary | ICD-10-CM

## 2013-06-28 LAB — GLUCOSE, CAPILLARY
Glucose-Capillary: 284 mg/dL — ABNORMAL HIGH (ref 70–99)
Glucose-Capillary: 220 mg/dL — ABNORMAL HIGH (ref 70–99)

## 2013-06-28 LAB — CBC
HCT: 24.6 % — ABNORMAL LOW (ref 39.0–52.0)
MCH: 25.9 pg — ABNORMAL LOW (ref 26.0–34.0)
MCHC: 31.3 g/dL (ref 30.0–36.0)
MCV: 82.8 fL (ref 78.0–100.0)
Platelets: 281 10*3/uL (ref 150–400)
RDW: 16.4 % — ABNORMAL HIGH (ref 11.5–15.5)
WBC: 7.1 10*3/uL (ref 4.0–10.5)

## 2013-06-28 LAB — BASIC METABOLIC PANEL
BUN: 24 mg/dL — ABNORMAL HIGH (ref 6–23)
Calcium: 8.4 mg/dL (ref 8.4–10.5)
Chloride: 105 mEq/L (ref 96–112)
Creatinine, Ser: 1.14 mg/dL (ref 0.50–1.35)
GFR calc Af Amer: 75 mL/min — ABNORMAL LOW (ref >90)

## 2013-06-28 MED ORDER — SULFAMETHOXAZOLE-TMP DS 800-160 MG PO TABS: 1.0000 | ORAL_TABLET | Freq: Every day | ORAL | Status: AC

## 2013-06-28 NOTE — Progress Notes (Signed)
I have reviewed this note and agree with all findings. Kati Edrick Whitehorn, PT, DPT Pager: [phone]   

## 2013-06-28 NOTE — Progress Notes (Signed)
INFECTIOUS DISEASE PROGRESS NOTE  ID: Miguel Leblanc is a 67 y.o. male with  Principal Problem:   Sepsis Active Problems:   UTI (lower urinary tract infection)   Ureteral obstruction, left   Acute renal failure  Subjective: Without complaints. Continue concerns about stones in urine.   Abtx:  Anti-infectives   Start     Dose/Rate Route Frequency Ordered Stop   06/28/13 0000  sulfamethoxazole-trimethoprim (BACTRIM DS) 800-160 MG per tablet     1 tablet Oral Daily at bedtime 06/28/13 0720     06/27/13 1545  ceFAZolin (ANCEF) IVPB 1 g/50 mL premix     1 g 100 mL/hr over 30 Minutes Intravenous 3 times per day 06/27/13 1537     06/27/13 1330  vancomycin (VANCOCIN) IVPB 1000 mg/200 mL premix  Status:  Discontinued     1,000 mg 200 mL/hr over 60 Minutes Intravenous Every 24 hours 06/27/13 1246 06/27/13 1537   06/26/13 1000  vancomycin (VANCOCIN) IVPB 750 mg/150 ml premix  Status:  Discontinued     750 mg 150 mL/hr over 60 Minutes Intravenous Every 24 hours 06/25/13 0829 06/25/13 0830   06/26/13 0600  cefTRIAXone (ROCEPHIN) 1 g in dextrose 5 % 50 mL IVPB  Status:  Discontinued     1 g 100 mL/hr over 30 Minutes Intravenous Every 24 hours 06/25/13 0817 06/27/13 1157   06/25/13 0900  vancomycin (VANCOCIN) IVPB 750 mg/150 ml premix  Status:  Discontinued     750 mg 150 mL/hr over 60 Minutes Intravenous Every 24 hours 06/25/13 0830 06/26/13 0758   06/25/13 0830  vancomycin (VANCOCIN) IVPB 1000 mg/200 mL premix  Status:  Discontinued     1,000 mg 200 mL/hr over 60 Minutes Intravenous  Once 06/25/13 0829 06/25/13 0830   06/25/13 0800  cefTRIAXone (ROCEPHIN) 1 g in dextrose 5 % 50 mL IVPB  Status:  Discontinued     1 g 100 mL/hr over 30 Minutes Intravenous Every 24 hours 06/25/13 0746 06/25/13 0817   06/25/13 0545  cefTRIAXone (ROCEPHIN) 1 g in dextrose 5 % 50 mL IVPB     1 g 100 mL/hr over 30 Minutes Intravenous  Once 06/25/13 0532 06/25/13 0634      Medications:  Scheduled: .   ceFAZolin (ANCEF) IV  1 g Intravenous Q8H  . Chlorhexidine Gluconate Cloth  6 each Topical Q0600  . enoxaparin (LOVENOX) injection  40 mg Subcutaneous Daily  . feeding supplement  237 mL Oral BID BM  . feeding supplement  237 mL Oral BID BM  . insulin aspart  0-5 Units Subcutaneous QHS  . insulin aspart  0-9 Units Subcutaneous TID WC  . mupirocin ointment  1 application Nasal BID  . polyethylene glycol  17 g Oral BID  . senna-docusate  1 tablet Oral BID  . simvastatin  20 mg Oral Daily  . sodium chloride  3 mL Intravenous Q12H    Objective: Vital signs in last 24 hours: Temp:  [97.4 F (36.3 C)-98.4 F (36.9 C)] 98.2 F (36.8 C) (09/10 1315) Pulse Rate:  [65-92] 65 (09/10 1315) Resp:  [16-20] 20 (09/10 1315) BP: (120-165)/(64-89) 120/64 mmHg (09/10 1315) SpO2:  [95 %-100 %] 100 % (09/10 1315) Weight:  [57.2 kg (126 lb 1.7 oz)] 57.2 kg (126 lb 1.7 oz) (09/09 2219)   General appearance: alert, cooperative and no distress Resp: clear to auscultation bilaterally Cardio: regular rate and rhythm GI: normal findings: bowel sounds normal and soft, non-tender  Lab Results  Recent  Labs  06/27/13 0452 06/28/13 0450  WBC 8.2 7.1  HGB 7.6* 7.7*  HCT 24.0* 24.6*  NA 132* 134*  K 5.1 4.9  CL 103 105  CO2 23 23  BUN 25* 24*  CREATININE 1.23 1.14   Liver Panel  Recent Labs  06/26/13 0525  PROT 6.3  ALBUMIN 1.6*  AST 11  ALT 5  ALKPHOS 110  BILITOT 0.2*   Sedimentation Rate No results found for this basename: ESRSEDRATE,  in the last 72 hours C-Reactive Protein No results found for this basename: CRP,  in the last 72 hours  Microbiology: Recent Results (from the past 240 hour(s))  URINE CULTURE     Status: None   Collection Time    06/25/13  5:02 AM      Result Value Range Status   Specimen Description URINE, CLEAN CATCH   Final   Special Requests NONE   Final   Culture  Setup Time     Final   Value: 06/25/2013 06:00     Performed at [facility] Count     Final   Value: >=100,000 COLONIES/ML     Performed at [facility]   Culture     Final   Value: STAPHYLOCOCCUS AUREUS     Note: RIFAMPIN AND GENTAMICIN SHOULD NOT BE USED AS SINGLE DRUGS FOR TREATMENT OF STAPH INFECTIONS.     Performed at [facility]   Report Status 06/27/2013 FINAL   Final   Organism ID, Bacteria STAPHYLOCOCCUS AUREUS   Final  CULTURE, BLOOD (ROUTINE X 2)     Status: None   Collection Time    06/25/13  8:25 AM      Result Value Range Status   Specimen Description BLOOD RIGHT ARM   Final   Special Requests BOTTLES DRAWN AEROBIC AND ANAEROBIC 10CC EACH   Final   Culture  Setup Time     Final   Value: 06/25/2013 16:16     Performed at [facility]   Culture     Final   Value:        BLOOD CULTURE RECEIVED NO GROWTH TO DATE CULTURE WILL BE HELD FOR 5 DAYS BEFORE ISSUING A FINAL NEGATIVE REPORT     Performed at [facility]   Report Status PENDING   Incomplete  CULTURE, BLOOD (ROUTINE X 2)     Status: None   Collection Time    06/25/13  8:40 AM      Result Value Range Status   Specimen Description BLOOD RIGHT HAND   Final   Special Requests BOTTLES DRAWN AEROBIC AND ANAEROBIC 10CC EACH   Final   Culture  Setup Time     Final   Value: 06/25/2013 16:16     Performed at [facility]   Culture     Final   Value:        BLOOD CULTURE RECEIVED NO GROWTH TO DATE CULTURE WILL BE HELD FOR 5 DAYS BEFORE ISSUING A FINAL NEGATIVE REPORT     Performed at [facility]   Report Status PENDING   Incomplete  MRSA PCR SCREENING     Status: Abnormal   Collection Time    06/25/13 10:32 AM      Result Value Range Status   MRSA by PCR POSITIVE (*) NEGATIVE Final   Comment:            The GeneXpert MRSA Assay (FDA  approved for NASAL specimens     only), is one component of a     comprehensive MRSA colonization     surveillance program. It is not     intended to diagnose MRSA     infection nor to guide or      monitor treatment for     MRSA infections.     RESULT CALLED TO, READ BACK BY AND VERIFIED WITH:     AGUIRRI,A RN 06/25/13 1304 WOOTEN,K    Studies/Results: No results found.   Assessment/Plan: MSSA UTI Nephrolithiasis  Anemia  Lumbar foraminal stenosis  Protein albumin malnutrition  DM (HgBA1C 10.6)  consider heme eval (outpt?) Continue ancef, would aim for 7 days then home with bacrtim til he sees urology.  His BCx are ngtd, would not pursue TEE unless this changes.   Total days of antibiotics: 4 (ancef)         Johny Sax Infectious Diseases (pager) [phone] [email or web address] 06/28/2013, 5:05 PM  LOS: 3 days

## 2013-06-28 NOTE — Progress Notes (Addendum)
Patient ID: Miguel Leblanc, male   DOB: 1946-03-16, 67 y.o.   MRN: [id number]    Subjective: Mr.  Leblanc is clinically stable.  His UA grew staph but he has not had symptoms of bacteremia despite the mild hydro.   There are still no notes from [place] in the chart. He continues to have debilitating back pain with movement.  ROS: He is voiding well but has passed some fine flecks in the urine.   He has no hematuria or dysuria.  He has no nausea or fever.   Objective: Vital signs in last 24 hours: Temp:  [97.4 F (36.3 C)-98.4 F (36.9 C)] 97.4 F (36.3 C) (09/10 0500) Pulse Rate:  [70-92] 84 (09/10 0500) Resp:  [12-22] 18 (09/10 0500) BP: (98-164)/(59-89) 164/89 mmHg (09/10 0500) SpO2:  [96 %-100 %] 99 % (09/10 0500) Weight:  [57.2 kg (126 lb 1.7 oz)] 57.2 kg (126 lb 1.7 oz) (09/09 2219)  Intake/Output from previous day: 09/09 0701 - 09/10 0700 In: 1530.4 [P.O.:240; I.V.:1040.4; IV Piggyback:250] Out: 650 [Urine:650] Intake/Output this shift:    General appearance: alert, no distress and but in obvious pain when he moves.   Lab Results:   Recent Labs  06/27/13 0452 06/28/13 0450  WBC 8.2 7.1  HGB 7.6* 7.7*  HCT 24.0* 24.6*  PLT 272 281   BMET  Recent Labs  06/27/13 0452 06/28/13 0450  NA 132* 134*  K 5.1 4.9  CL 103 105  CO2 23 23  GLUCOSE 176* 241*  BUN 25* 24*  CREATININE 1.23 1.14  CALCIUM 8.4 8.4   PT/INR No results found for this basename: LABPROT, INR,  in the last 72 hours ABG No results found for this basename: PHART, PCO2, PO2, HCO3,  in the last 72 hours  Studies/Results: No results found.  Anti-infectives: Anti-infectives   Start     Dose/Rate Route Frequency Ordered Stop   06/27/13 1545  ceFAZolin (ANCEF) IVPB 1 g/50 mL premix     1 g 100 mL/hr over 30 Minutes Intravenous 3 times per day 06/27/13 1537     06/27/13 1330  vancomycin (VANCOCIN) IVPB 1000 mg/200 mL premix  Status:  Discontinued     1,000 mg 200 mL/hr over 60 Minutes  Intravenous Every 24 hours 06/27/13 1246 06/27/13 1537   06/26/13 1000  vancomycin (VANCOCIN) IVPB 750 mg/150 ml premix  Status:  Discontinued     750 mg 150 mL/hr over 60 Minutes Intravenous Every 24 hours 06/25/13 0829 06/25/13 0830   06/26/13 0600  cefTRIAXone (ROCEPHIN) 1 g in dextrose 5 % 50 mL IVPB  Status:  Discontinued     1 g 100 mL/hr over 30 Minutes Intravenous Every 24 hours 06/25/13 0817 06/27/13 1157   06/25/13 0900  vancomycin (VANCOCIN) IVPB 750 mg/150 ml premix  Status:  Discontinued     750 mg 150 mL/hr over 60 Minutes Intravenous Every 24 hours 06/25/13 0830 06/26/13 0758   06/25/13 0830  vancomycin (VANCOCIN) IVPB 1000 mg/200 mL premix  Status:  Discontinued     1,000 mg 200 mL/hr over 60 Minutes Intravenous  Once 06/25/13 0829 06/25/13 0830   06/25/13 0800  cefTRIAXone (ROCEPHIN) 1 g in dextrose 5 % 50 mL IVPB  Status:  Discontinued     1 g 100 mL/hr over 30 Minutes Intravenous Every 24 hours 06/25/13 0746 06/25/13 0817   06/25/13 0545  cefTRIAXone (ROCEPHIN) 1 g in dextrose 5 % 50 mL IVPB     1 g 100 mL/hr  over 30 Minutes Intravenous  Once 06/25/13 0532 06/25/13 1610      Current Facility-Administered Medications  Medication Dose Route Frequency Provider Last Rate Last Dose  . 0.9 %  sodium chloride infusion   Intravenous Continuous Belkys A Regalado, MD 100 mL/hr at 06/28/13 0308    . acetaminophen (TYLENOL) tablet 650 mg  650 mg Oral Q4H PRN Belkys A Regalado, MD      . ceFAZolin (ANCEF) IVPB 1 g/50 mL premix  1 g Intravenous Q8H Ginnie Smart, MD   1 g at 06/28/13 0557  . Chlorhexidine Gluconate Cloth 2 % PADS 6 each  6 each Topical Q0600 Alba Cory, MD   6 each at 06/28/13 0557  . enoxaparin (LOVENOX) injection 40 mg  40 mg Subcutaneous Daily Crystal [place], RPH   40 mg at 06/27/13 0931  . feeding supplement (ENSURE COMPLETE) liquid 237 mL  237 mL Oral BID BM Belkys A Regalado, MD   237 mL at 06/26/13 1034  . feeding supplement (ENSURE  COMPLETE) liquid 237 mL  237 mL Oral BID BM Belkys A Regalado, MD   237 mL at 06/27/13 1358  . HYDROcodone-acetaminophen (NORCO/VICODIN) 5-325 MG per tablet 1-2 tablet  1-2 tablet Oral Q4H PRN Alba Cory, MD   2 tablet at 06/27/13 2113  . insulin aspart (novoLOG) injection 0-5 Units  0-5 Units Subcutaneous QHS Belkys A Regalado, MD   5 Units at 06/27/13 2252  . insulin aspart (novoLOG) injection 0-9 Units  0-9 Units Subcutaneous TID WC Belkys A Regalado, MD   5 Units at 06/27/13 1205  . morphine 2 MG/ML injection 1 mg  1 mg Intravenous Q3H PRN Belkys A Regalado, MD   1 mg at 06/28/13 0307  . mupirocin ointment (BACTROBAN) 2 % 1 application  1 application Nasal BID Alba Cory, MD   1 application at 06/27/13 2113  . ondansetron (ZOFRAN) tablet 4 mg  4 mg Oral Q6H PRN Belkys A Regalado, MD       Or  . ondansetron (ZOFRAN) injection 4 mg  4 mg Intravenous Q6H PRN Belkys A Regalado, MD      . polyethylene glycol (MIRALAX / GLYCOLAX) packet 17 g  17 g Oral BID Belkys A Regalado, MD   17 g at 06/27/13 2113  . senna-docusate (Senokot-S) tablet 1 tablet  1 tablet Oral BID Alba Cory, MD   1 tablet at 06/27/13 2113  . simvastatin (ZOCOR) tablet 20 mg  20 mg Oral Daily Belkys A Regalado, MD   20 mg at 06/27/13 0927  . sodium chloride 0.9 % injection 3 mL  3 mL Intravenous Q12H Belkys A Regalado, MD   3 mL at 06/26/13 2122    Assessment: He continues to have back pain. His UA grew Staph but he has no symptoms of infection. Records from [place] not available, but I don't believe he has had a bone marrow biopsy based on his response to my questioning.   Plan: I would like for him to be on bactrim ds po qhs for suppression of UTI post discharge until he can have his cystoscopy and biopsy. He needs increased pain management. Consider Heme-Onc consult for bone marrow assessment since that doesn't seem to have been done and he has marrow changes on MRI and the anemia.     LOS: 3 days     Irving Bloor J 06/28/2013   I found the [facility] notes in his hard chart and on review, the information  is mostly regarding his wound care issues.   There was one page that referred to his iron deficiency anemia and need for transfusion, but the extent of his anemia evaluation wasn't clear.

## 2013-06-28 NOTE — Progress Notes (Signed)
[facility] PROGRESS NOTE  Miguel Leblanc RUE:[id number] DOB: 06-01-1946 DOA: 06/25/2013 PCP: Pcp Not In System  Assessment/Plan:  Sepsis  Present on admission evidence by hypotension, mild lactic acidosis, tachycardia, with a source of infection suspected to be urinary tract.  UA showing pyuria, and cultures positive for Staph. Infectious disease consulted. He was given 2 days of IV ceftriaxone initially, now on cefazolin IV. Clinically improved.   Obstructive Uropathy/ Pyelonephritis:  Imaging showing left ureter with moderate proximal obstruction, and nodular prostate gland.  Dr Wilson Singer of urology is planning cystoscopy with RTG's and prostate [place] with biopsy at [facility], outpatient procedure on 9/23.   Acute renal failure Likely secondary to obstructive uropathy. Kidney function improved.   Diabetes: Had episodes of hypoglycemia, for which lantus was held. Continue with SSI.   Anemia   Patient was transfused 1 unit PRBC on 9/8. I have consulted Dr Cyndie Chime of heme/onc consulted. Have ordered a myeloma panel.   Chronic Right LE ulcer: no significant drainage, wound care consulted.   Severe Back Pain  MRI showing lumbar spondylosis and degenerative disc disease causing prominent impingement at L4-5 and moderate impingement at L3-4 and  L5-S 1. Small nonspecific lesion in the S3 vertebral body with adjacent low T1 signal This may be a small chondroid or vascular lesion but was not visible in 2008. Localized amyloid deposition could have a similar appearance. This may warrant followup imaging to exclude progression/malignancy. Small hemangioma in the S1 vertebral body. I have consulted Dr Cyndie Chime.    Code Status: Full Code.  Family Communication: Care discussed with Patient.  Disposition Plan:    Consultants: Dr Wilson Singer  Procedures:  none  Antibiotics:  Ceftriaxone 9-8  Vancomycin 9-8  Cephazolin 1g IV q 8 hours  HPI/Subjective: feeling better.  Still complaining of back pain.   Objective: Filed Vitals:   06/28/13 1315  BP: 120/64  Pulse: 65  Temp: 98.2 F (36.8 C)  Resp: 20    Intake/Output Summary (Last 24 hours) at 06/28/13 1542 Last data filed at 06/28/13 1300  Gross per 24 hour  Intake    970 ml  Output   1025 ml  Net    -55 ml   Filed Weights   06/26/13 0419 06/27/13 0457 06/27/13 2219  Weight: 50.3 kg (110 lb 14.3 oz) 50 kg (110 lb 3.7 oz) 57.2 kg (126 lb 1.7 oz)    Exam:   General:  No distress.   Cardiovascular: S 1, S 2 RRR  Respiratory: CTA  Abdomen: BS present, soft, nt  Musculoskeletal: patient reporting pain to palpation over the lower back region  Skin: Chronic ulceration noted over the shin of right lower extremity.    Data Reviewed: Basic Metabolic Panel:  Recent Labs Lab 06/25/13 0437 06/25/13 1100 06/26/13 0525 06/27/13 0452 06/28/13 0450  NA 133*  --  132* 132* 134*  K 4.3  --  4.7 5.1 4.9  CL 98  --  103 103 105  CO2 24  --  23 23 23   GLUCOSE 127*  --  76 176* 241*  BUN 39*  --  27* 25* 24*  CREATININE 1.64* 1.34 1.31 1.23 1.14  CALCIUM 8.7  --  8.5 8.4 8.4   Liver Function Tests:  Recent Labs Lab 06/25/13 0437 06/26/13 0525  AST 12 11  ALT 6 5  ALKPHOS 105 110  BILITOT 0.1* 0.2*  PROT 6.9 6.3  ALBUMIN 1.9* 1.6*   No results found for this basename:  LIPASE, AMYLASE,  in the last 168 hours No results found for this basename: AMMONIA,  in the last 168 hours CBC:  Recent Labs Lab 06/25/13 0437 06/25/13 1100 06/26/13 0525 06/27/13 0452 06/28/13 0450  WBC 10.5 10.6* 9.3 8.2 7.1  NEUTROABS 9.1*  --   --   --   --   HGB 7.0* 8.2* 6.6* 7.6* 7.7*  HCT 22.4* 25.9* 21.0* 24.0* 24.6*  MCV 80.0 79.9 80.8 82.2 82.8  PLT 305 394 301 272 281   Cardiac Enzymes: No results found for this basename: CKTOTAL, CKMB, CKMBINDEX, TROPONINI,  in the last 168 hours BNP (last 3 results) No results found for this basename: PROBNP,  in the last 8760 hours CBG:  Recent  Labs Lab 06/27/13 1200 06/27/13 1640 06/27/13 2222 06/28/13 0733 06/28/13 1136  GLUCAP 269* 415* 356* 249* 284*    Recent Results (from the past 240 hour(s))  URINE CULTURE     Status: None   Collection Time    06/25/13  5:02 AM      Result Value Range Status   Specimen Description URINE, CLEAN CATCH   Final   Special Requests NONE   Final   Culture  Setup Time     Final   Value: 06/25/2013 06:00     Performed at [facility] Count     Final   Value: >=100,000 COLONIES/ML     Performed at [facility]   Culture     Final   Value: STAPHYLOCOCCUS AUREUS     Note: RIFAMPIN AND GENTAMICIN SHOULD NOT BE USED AS SINGLE DRUGS FOR TREATMENT OF STAPH INFECTIONS.     Performed at [facility]   Report Status 06/27/2013 FINAL   Final   Organism ID, Bacteria STAPHYLOCOCCUS AUREUS   Final  CULTURE, BLOOD (ROUTINE X 2)     Status: None   Collection Time    06/25/13  8:25 AM      Result Value Range Status   Specimen Description BLOOD RIGHT ARM   Final   Special Requests BOTTLES DRAWN AEROBIC AND ANAEROBIC 10CC EACH   Final   Culture  Setup Time     Final   Value: 06/25/2013 16:16     Performed at [facility]   Culture     Final   Value:        BLOOD CULTURE RECEIVED NO GROWTH TO DATE CULTURE WILL BE HELD FOR 5 DAYS BEFORE ISSUING A FINAL NEGATIVE REPORT     Performed at [facility]   Report Status PENDING   Incomplete  CULTURE, BLOOD (ROUTINE X 2)     Status: None   Collection Time    06/25/13  8:40 AM      Result Value Range Status   Specimen Description BLOOD RIGHT HAND   Final   Special Requests BOTTLES DRAWN AEROBIC AND ANAEROBIC 10CC EACH   Final   Culture  Setup Time     Final   Value: 06/25/2013 16:16     Performed at [facility]   Culture     Final   Value:        BLOOD CULTURE RECEIVED NO GROWTH TO DATE CULTURE WILL BE HELD FOR 5 DAYS BEFORE ISSUING A FINAL NEGATIVE REPORT     Performed at [facility]    Report Status PENDING   Incomplete  MRSA PCR SCREENING     Status: Abnormal   Collection Time  06/25/13 10:32 AM      Result Value Range Status   MRSA by PCR POSITIVE (*) NEGATIVE Final   Comment:            The GeneXpert MRSA Assay (FDA     approved for NASAL specimens     only), is one component of a     comprehensive MRSA colonization     surveillance program. It is not     intended to diagnose MRSA     infection nor to guide or     monitor treatment for     MRSA infections.     RESULT CALLED TO, READ BACK BY AND VERIFIED WITH:     AGUIRRI,A RN 06/25/13 1304 WOOTEN,K     Studies: No results found.  Scheduled Meds: .  ceFAZolin (ANCEF) IV  1 g Intravenous Q8H  . Chlorhexidine Gluconate Cloth  6 each Topical Q0600  . enoxaparin (LOVENOX) injection  40 mg Subcutaneous Daily  . feeding supplement  237 mL Oral BID BM  . feeding supplement  237 mL Oral BID BM  . insulin aspart  0-5 Units Subcutaneous QHS  . insulin aspart  0-9 Units Subcutaneous TID WC  . mupirocin ointment  1 application Nasal BID  . polyethylene glycol  17 g Oral BID  . senna-docusate  1 tablet Oral BID  . simvastatin  20 mg Oral Daily  . sodium chloride  3 mL Intravenous Q12H   Continuous Infusions: . sodium chloride 100 mL/hr at 06/28/13 0308    Principal Problem:   Sepsis Active Problems:   UTI (lower urinary tract infection)   Ureteral obstruction, left   Acute renal failure    Time spent: 35 minutes.     Jeralyn Bennett  [facility] Pager [phone]. If 7PM-7AM, please contact night-coverage at [email or web address], password [facility] 06/28/2013, 3:42 PM  LOS: 3 days

## 2013-06-28 NOTE — Progress Notes (Addendum)
Inpatient Diabetes Program Recommendations  AACE/ADA: New Consensus Statement on Inpatient Glycemic Control (2013)  Target Ranges:  Prepandial:   less than 140 mg/dL      Peak postprandial:   less than 180 mg/dL (1-2 hours)      Critically ill patients:  140 - 180 mg/dL  Noted lantus was discontinued due to hypoglycemia. However, pt needs some basal as fastings are high and high post-prandials as well. Continued hyperglycemia in presence of UTI Glucose control is extremely important and helpful with resolution of UTI UTI infections are common with pts with DM who are not controlled. Inpatient Diabetes Program Recommendations Insulin - Basal: Please add some basal Lantus .  Pt takes 20-30 units at home.  Please start with 10 units, preferably 15 units of Lantus at HS  Thank you, Lenor Coffin, RN, CNS, Diabetes Coordinator [phone])

## 2013-06-28 NOTE — Clinical Social Work Psychosocial (Signed)
Clinical Social Work Department BRIEF PSYCHOSOCIAL ASSESSMENT 06/28/2013  Patient:  JARREL, KNOKE     Account Number:  [id number]     Admit date:  06/25/2013  Clinical Social Worker:  Delmer Islam  Date/Time:  06/28/2013 01:12 AM  Referred by:  CSW  Date Referred:  06/28/2013 Referred for  SNF Placement   Other Referral:   Pt recommended SNF   Interview type:  Patient Other interview type:    PSYCHOSOCIAL DATA Living Status:  WIFE Admitted from facility:   Level of care:   Primary support name:  Daivik Overley Primary support relationship to patient:  SPOUSE Degree of support available:    CURRENT CONCERNS Current Concerns  Post-Acute Placement   Other Concerns:    SOCIAL WORK ASSESSMENT / PLAN CSW reviewed chart and noted that PT recommending SNF, especially if patient's pain is not under control. CSW inbtroduced self and explained purpose of visit and physical therapist's recommendation. When asked, patient responded that he has not been to a facility for rehab. CSW explained SNF search process, provided list of facilities in [facility] and answered patient's questions. Patient agreeable to short-term rehab but feels he is not ready to discharge yet. CSW offered support and explained that MD will make discharge determination.   Assessment/plan status:  Psychosocial Support/Ongoing Assessment of Needs Other assessment/ plan:   Information/referral to community resources:   SNF list for [facility]    [name]/FAMILY'S RESPONSE TO PLAN OF CARE: Patient pleasant and open to talking with CSW and is agreeable to ST reahb at a skilled facility.
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
## 2013-06-29 ENCOUNTER — Inpatient Hospital Stay (HOSPITAL_COMMUNITY): Payer: Managed Care, Other (non HMO)

## 2013-06-29 LAB — BASIC METABOLIC PANEL
BUN: 21 mg/dL (ref 6–23)
Chloride: 101 mEq/L (ref 96–112)
GFR calc Af Amer: 77 mL/min — ABNORMAL LOW (ref >90)
GFR calc non Af Amer: 67 mL/min — ABNORMAL LOW (ref >90)
Potassium: 5 mEq/L (ref 3.5–5.1)
Sodium: 132 mEq/L — ABNORMAL LOW (ref 135–145)

## 2013-06-29 LAB — CBC
MCHC: 30.4 g/dL (ref 30.0–36.0)
Platelets: 281 10*3/uL (ref 150–400)
RDW: 16.5 % — ABNORMAL HIGH (ref 11.5–15.5)
WBC: 6.1 10*3/uL (ref 4.0–10.5)

## 2013-06-29 LAB — GLUCOSE, CAPILLARY
Glucose-Capillary: 190 mg/dL — ABNORMAL HIGH (ref 70–99)
Glucose-Capillary: 124 mg/dL — ABNORMAL HIGH (ref 70–99)

## 2013-06-29 NOTE — Progress Notes (Signed)
INFECTIOUS DISEASE PROGRESS NOTE  ID: Miguel Leblanc is a 67 y.o. male with  Principal Problem:   Sepsis Active Problems:   UTI (lower urinary tract infection)   Ureteral obstruction, left   Acute renal failure  Subjective: Concerned about back pain.   Abtx:  Anti-infectives   Start     Dose/Rate Route Frequency Ordered Stop   06/28/13 0000  sulfamethoxazole-trimethoprim (BACTRIM DS) 800-160 MG per tablet     1 tablet Oral Daily at bedtime 06/28/13 0720     06/27/13 1545  ceFAZolin (ANCEF) IVPB 1 g/50 mL premix     1 g 100 mL/hr over 30 Minutes Intravenous 3 times per day 06/27/13 1537     06/27/13 1330  vancomycin (VANCOCIN) IVPB 1000 mg/200 mL premix  Status:  Discontinued     1,000 mg 200 mL/hr over 60 Minutes Intravenous Every 24 hours 06/27/13 1246 06/27/13 1537   06/26/13 1000  vancomycin (VANCOCIN) IVPB 750 mg/150 ml premix  Status:  Discontinued     750 mg 150 mL/hr over 60 Minutes Intravenous Every 24 hours 06/25/13 0829 06/25/13 0830   06/26/13 0600  cefTRIAXone (ROCEPHIN) 1 g in dextrose 5 % 50 mL IVPB  Status:  Discontinued     1 g 100 mL/hr over 30 Minutes Intravenous Every 24 hours 06/25/13 0817 06/27/13 1157   06/25/13 0900  vancomycin (VANCOCIN) IVPB 750 mg/150 ml premix  Status:  Discontinued     750 mg 150 mL/hr over 60 Minutes Intravenous Every 24 hours 06/25/13 0830 06/26/13 0758   06/25/13 0830  vancomycin (VANCOCIN) IVPB 1000 mg/200 mL premix  Status:  Discontinued     1,000 mg 200 mL/hr over 60 Minutes Intravenous  Once 06/25/13 0829 06/25/13 0830   06/25/13 0800  cefTRIAXone (ROCEPHIN) 1 g in dextrose 5 % 50 mL IVPB  Status:  Discontinued     1 g 100 mL/hr over 30 Minutes Intravenous Every 24 hours 06/25/13 0746 06/25/13 0817   06/25/13 0545  cefTRIAXone (ROCEPHIN) 1 g in dextrose 5 % 50 mL IVPB     1 g 100 mL/hr over 30 Minutes Intravenous  Once 06/25/13 0532 06/25/13 0634      Medications:  Scheduled: .  ceFAZolin (ANCEF) IV  1 g Intravenous  Q8H  . Chlorhexidine Gluconate Cloth  6 each Topical Q0600  . enoxaparin (LOVENOX) injection  40 mg Subcutaneous Daily  . feeding supplement  237 mL Oral BID BM  . insulin aspart  0-5 Units Subcutaneous QHS  . insulin aspart  0-9 Units Subcutaneous TID WC  . mupirocin ointment  1 application Nasal BID  . polyethylene glycol  17 g Oral BID  . senna-docusate  1 tablet Oral BID  . simvastatin  20 mg Oral Daily  . sodium chloride  3 mL Intravenous Q12H    Objective: Vital signs in last 24 hours: Temp:  [97.2 F (36.2 C)-97.5 F (36.4 C)] 97.5 F (36.4 C) (09/11 1330) Pulse Rate:  [82-93] 84 (09/11 1330) Resp:  [17-20] 17 (09/11 1330) BP: (123-176)/(72-92) 148/92 mmHg (09/11 1330) SpO2:  [99 %-100 %] 99 % (09/11 1330)   General appearance: alert, cooperative and no distress Resp: clear to auscultation bilaterally Cardio: regular rate and rhythm GI: normal findings: bowel sounds normal and soft, non-tender Extremities: R ankle wrapped.   Lab Results  Recent Labs  06/28/13 0450 06/29/13 0615  WBC 7.1 6.1  HGB 7.7* 8.6*  HCT 24.6* 28.3*  NA 134* 132*  K 4.9  5.0  CL 105 101  CO2 23 22  BUN 24* 21  CREATININE 1.14 1.11   Liver Panel No results found for this basename: PROT, ALBUMIN, AST, ALT, ALKPHOS, BILITOT, BILIDIR, IBILI,  in the last 72 hours Sedimentation Rate No results found for this basename: ESRSEDRATE,  in the last 72 hours C-Reactive Protein No results found for this basename: CRP,  in the last 72 hours  Microbiology: Recent Results (from the past 240 hour(s))  URINE CULTURE     Status: None   Collection Time    06/25/13  5:02 AM      Result Value Range Status   Specimen Description URINE, CLEAN CATCH   Final   Special Requests NONE   Final   Culture  Setup Time     Final   Value: 06/25/2013 06:00     Performed at [facility] Count     Final   Value: >=100,000 COLONIES/ML     Performed at [facility]   Culture     Final     Value: STAPHYLOCOCCUS AUREUS     Note: RIFAMPIN AND GENTAMICIN SHOULD NOT BE USED AS SINGLE DRUGS FOR TREATMENT OF STAPH INFECTIONS.     Performed at [facility]   Report Status 06/27/2013 FINAL   Final   Organism ID, Bacteria STAPHYLOCOCCUS AUREUS   Final  CULTURE, BLOOD (ROUTINE X 2)     Status: None   Collection Time    06/25/13  8:25 AM      Result Value Range Status   Specimen Description BLOOD RIGHT ARM   Final   Special Requests BOTTLES DRAWN AEROBIC AND ANAEROBIC 10CC EACH   Final   Culture  Setup Time     Final   Value: 06/25/2013 16:16     Performed at [facility]   Culture     Final   Value:        BLOOD CULTURE RECEIVED NO GROWTH TO DATE CULTURE WILL BE HELD FOR 5 DAYS BEFORE ISSUING A FINAL NEGATIVE REPORT     Performed at [facility]   Report Status PENDING   Incomplete  CULTURE, BLOOD (ROUTINE X 2)     Status: None   Collection Time    06/25/13  8:40 AM      Result Value Range Status   Specimen Description BLOOD RIGHT HAND   Final   Special Requests BOTTLES DRAWN AEROBIC AND ANAEROBIC 10CC EACH   Final   Culture  Setup Time     Final   Value: 06/25/2013 16:16     Performed at [facility]   Culture     Final   Value:        BLOOD CULTURE RECEIVED NO GROWTH TO DATE CULTURE WILL BE HELD FOR 5 DAYS BEFORE ISSUING A FINAL NEGATIVE REPORT     Performed at [facility]   Report Status PENDING   Incomplete  MRSA PCR SCREENING     Status: Abnormal   Collection Time    06/25/13 10:32 AM      Result Value Range Status   MRSA by PCR POSITIVE (*) NEGATIVE Final   Comment:            The GeneXpert MRSA Assay (FDA     approved for NASAL specimens     only), is one component of a     comprehensive MRSA colonization     surveillance program. It is  not     intended to diagnose MRSA     infection nor to guide or     monitor treatment for     MRSA infections.     RESULT CALLED TO, READ BACK BY AND VERIFIED WITH:     AGUIRRI,A RN  06/25/13 1304 WOOTEN,K    Studies/Results: Dg Bone Survey Met  06/29/2013   *RADIOLOGY REPORT*  Clinical Data: Abnormal MRI, evaluate for myeloma  METASTATIC BONE SURVEY  Comparison: MR lumbar spine of 06/25/2013  Findings: The lateral view of the skull shows no radiolucent lesion.  No bony abnormality is seen.  The cervical vertebrae are straightened in alignment.  The bones appear somewhat osteopenic.  There are diffuse degenerative changes noted with anterior osteophytes and loss of disc spaces C4-5. There appears to be fusion at the C5-6 level.  Views of the thoracic spine shows slight thoracic kyphosis and diffuse degenerative change.  No compression deformity is seen.  The lumbar vertebrae are straightened in alignment.  Diffuse degenerative change is noted.  No compression deformity is seen.  A view of the pelvis shows no definite sacral lesion.  There is significant degenerative osteophyte formation on the left at the L5- S1 level which could account for some of the abnormality noted on MRI.  Both hips are in normal position with mild degenerative change.  Views of both femurs show no radiolucent lesion.  Views of the right shoulder and right humerus show degenerative change of the right shoulder most typical of chronic rotator cuff disease as well as degenerative joint disease involving the shoulder.  However no radiolucent lesion is seen.  Views of the left shoulder showed moderate degenerative joint disease of the left shoulder with less involvement of the rotator cuff noted on the right.  There is degenerative change at the left [facility] joint.  No radiolucent lesion is seen.  A frontal chest x-ray shows the lungs be clear.  Mediastinal contours appear normal.  The heart is within normal limits in size. The bones appear osteopenic with degenerative changes throughout the thoracic spine.  IMPRESSION:  1.  No radiographic evidence of multiple myeloma is seen. 2.  Diffuse degenerative change. 3.  The  abnormality noted by MR involving the left sacrum may represent significant osteophyte formation at the L5 S1 level on the left.  No definite sacral lesion is identified by plain film.   Original Report Authenticated By: Dwyane Dee, M.D.     Assessment/Plan: MSSA UTI  Nephrolithiasis  Anemia  Lumbar foraminal stenosis  Protein albumin malnutrition  DM (HgBA1C 10.6)  Total days of antibiotics: 5 ancef  Plan for 7 days of ancef.  Home with bactrim til he has uro eval.  Use bactrim with caution (can worsen anemia, renal function). He does not need bactrim and ancef simultaneously Heme eval at some point.... Available if questions.          Johny Sax Infectious Diseases (pager) [phone] [email or web address] 06/29/2013, 3:10 PM  LOS: 4 days

## 2013-06-29 NOTE — Clinical Social Work Placement (Addendum)
Clinical Social Work Department CLINICAL SOCIAL WORK PLACEMENT NOTE 06/29/2013  Patient:  [name], Miguel Leblanc  Account Number:  [id number] Admit date:  06/25/2013  Clinical Social Worker:  Genelle Bal, LCSW  Date/time:  06/29/2013 06:21 AM  Clinical Social Work is seeking post-discharge placement for this patient at the following level of care:   SKILLED NURSING   (*CSW will update this form in Epic as items are completed)   06/28/2013  Patient/family provided with [facility] Department of Clinical Social Work's list of facilities offering this level of care within the geographic area requested by the patient (or if unable, by the patient's family).  06/28/2013  Patient/family informed of their freedom to choose among providers that offer the needed level of care, that participate in Medicare, Medicaid or managed care program needed by the patient, have an available bed and are willing to accept the patient.    Patient/family informed of [facility]' ownership interest in [facility], as well as of the fact that they are under no obligation to receive care at this facility.  PASARR submitted to EDS on  PASARR number received from EDS on   FL2 transmitted to all facilities in geographic area requested by pt/family on  06/28/2013 FL2 transmitted to all facilities within larger geographic area on 06/29/13   Patient informed that his/her managed care company has contracts with or will negotiate with  certain facilities, including the following:     Patient/family informed of bed offers received:  06/29/2013 Patient chooses bed at [facility] Physician recommends and patient chooses bed at    Patient to be transferred to [facility] on 07/04/14 Patient to be transferred to facility by ambulance  The following physician request were entered in Epic:   Additional Comments: 06/29/13 - CSW talked with patient, wife and daughter [name] and gave bed  offers. Family wants [facility], however they did not make a bed offer. Wife and daughter requested that CSW check on facilitiies in [place]. Wife also provided patient's ss card as CSW unable to get PASARR due to discrepency in social security number. 06/30/13 - CSW was advised by daughter of family's decision on placement. Text page to MD to advise of SNF selection and get d/c date to advise facility.  *CSW contacted by MD regarding patient and doctor was advised of facility selection and that call was made to faciity and message left. MD also advised that patient wil not be able to discharge over weekend as facility will have to obtain insurance pre-authorization from [place].

## 2013-06-29 NOTE — Progress Notes (Signed)
[facility] PROGRESS NOTE  Miguel Leblanc ZOX:[id number] DOB: Jul 01, 1946 DOA: 06/25/2013 PCP: Pcp Not In System  Assessment/Plan:  Sepsis  Present on admission evidence by hypotension, mild lactic acidosis, tachycardia, with a source of infection suspected to be urinary tract.  UA showing pyuria, and cultures positive for Staph. Infectious disease consulted. He was given 2 days of IV ceftriaxone initially, now on cefazolin IV. Patient remaining afebrile.   Obstructive Uropathy/ Pyelonephritis:  Imaging showing left ureter with moderate proximal obstruction, and nodular prostate gland.  Dr Wilson Singer of urology is planning cystoscopy with RTG's and prostate [place] with biopsy at [facility], outpatient procedure on 9/23. Patient on Cefzil and IV.  Acute renal failure Likely secondary to obstructive uropathy. Kidney function improved.   Diabetes: Had episodes of hypoglycemia, for which lantus was held. Continue with SSI.   Anemia   Patient was transfused 1 unit PRBC on 9/8. I have consulted Dr Cyndie Chime of heme/onc consulted. Have ordered a myeloma panel. Patient with history of chronic anemia, had seen hematologist in [facility]. I discussed case with Cheree Ditto Dr. Cheree Ditto, findings on MRI unlikely to be malignancy. He recommended further workup as an outpatient.  Chronic Right LE ulcer: no significant drainage, wound care consulted.   Severe Back Pain  MRI showing lumbar spondylosis and degenerative disc disease causing prominent impingement at L4-5 and moderate impingement at L3-4 and  L5-S 1. Patient will likely benefit from physical therapy.  Code Status: Full Code.  Family Communication: Care discussed with Patient.  Disposition Plan: Social work consult, plan for transition to skilled nursing facility for rehabilitation   Consultants: Dr Wilson Singer  Procedures:  none  Antibiotics:  Ceftriaxone 9-8  Vancomycin 9-8  Cephazolin 1g IV q 8  hours  HPI/Subjective: feeling better. Still complaining of back pain.   Objective: Filed Vitals:   06/29/13 1330  BP: 148/92  Pulse: 84  Temp: 97.5 F (36.4 C)  Resp: 17    Intake/Output Summary (Last 24 hours) at 06/29/13 1711 Last data filed at 06/29/13 1331  Gross per 24 hour  Intake    480 ml  Output    850 ml  Net   -370 ml   Filed Weights   06/26/13 0419 06/27/13 0457 06/27/13 2219  Weight: 50.3 kg (110 lb 14.3 oz) 50 kg (110 lb 3.7 oz) 57.2 kg (126 lb 1.7 oz)    Exam:   General:  No distress.   Cardiovascular: S 1, S 2 RRR  Respiratory: CTA  Abdomen: BS present, soft, nt  Musculoskeletal: patient reporting pain to palpation over the lower back region  Skin: Chronic ulceration noted over the shin of right lower extremity.    Data Reviewed: Basic Metabolic Panel:  Recent Labs Lab 06/25/13 0437 06/25/13 1100 06/26/13 0525 06/27/13 0452 06/28/13 0450 06/29/13 0615  NA 133*  --  132* 132* 134* 132*  K 4.3  --  4.7 5.1 4.9 5.0  CL 98  --  103 103 105 101  CO2 24  --  23 23 23 22   GLUCOSE 127*  --  76 176* 241* 283*  BUN 39*  --  27* 25* 24* 21  CREATININE 1.64* 1.34 1.31 1.23 1.14 1.11  CALCIUM 8.7  --  8.5 8.4 8.4 8.8   Liver Function Tests:  Recent Labs Lab 06/25/13 0437 06/26/13 0525 06/28/13 1606  AST 12 11  --   ALT 6 5  --   ALKPHOS 105 110  --   BILITOT  0.1* 0.2*  --   PROT 6.9 6.3 6.4  ALBUMIN 1.9* 1.6*  --    No results found for this basename: LIPASE, AMYLASE,  in the last 168 hours No results found for this basename: AMMONIA,  in the last 168 hours CBC:  Recent Labs Lab 06/25/13 0437 06/25/13 1100 06/26/13 0525 06/27/13 0452 06/28/13 0450 06/29/13 0615  WBC 10.5 10.6* 9.3 8.2 7.1 6.1  NEUTROABS 9.1*  --   --   --   --   --   HGB 7.0* 8.2* 6.6* 7.6* 7.7* 8.6*  HCT 22.4* 25.9* 21.0* 24.0* 24.6* 28.3*  MCV 80.0 79.9 80.8 82.2 82.8 83.7  PLT 305 394 301 272 281 281   Cardiac Enzymes: No results found for this  basename: CKTOTAL, CKMB, CKMBINDEX, TROPONINI,  in the last 168 hours BNP (last 3 results) No results found for this basename: PROBNP,  in the last 8760 hours CBG:  Recent Labs Lab 06/28/13 1136 06/28/13 1703 06/28/13 2148 06/29/13 0746 06/29/13 1153  GLUCAP 284* 220* 223* 258* 300*    Recent Results (from the past 240 hour(s))  URINE CULTURE     Status: None   Collection Time    06/25/13  5:02 AM      Result Value Range Status   Specimen Description URINE, CLEAN CATCH   Final   Special Requests NONE   Final   Culture  Setup Time     Final   Value: 06/25/2013 06:00     Performed at [facility] Count     Final   Value: >=100,000 COLONIES/ML     Performed at [facility]   Culture     Final   Value: STAPHYLOCOCCUS AUREUS     Note: RIFAMPIN AND GENTAMICIN SHOULD NOT BE USED AS SINGLE DRUGS FOR TREATMENT OF STAPH INFECTIONS.     Performed at [facility]   Report Status 06/27/2013 FINAL   Final   Organism ID, Bacteria STAPHYLOCOCCUS AUREUS   Final  CULTURE, BLOOD (ROUTINE X 2)     Status: None   Collection Time    06/25/13  8:25 AM      Result Value Range Status   Specimen Description BLOOD RIGHT ARM   Final   Special Requests BOTTLES DRAWN AEROBIC AND ANAEROBIC 10CC EACH   Final   Culture  Setup Time     Final   Value: 06/25/2013 16:16     Performed at [facility]   Culture     Final   Value:        BLOOD CULTURE RECEIVED NO GROWTH TO DATE CULTURE WILL BE HELD FOR 5 DAYS BEFORE ISSUING A FINAL NEGATIVE REPORT     Performed at [facility]   Report Status PENDING   Incomplete  CULTURE, BLOOD (ROUTINE X 2)     Status: None   Collection Time    06/25/13  8:40 AM      Result Value Range Status   Specimen Description BLOOD RIGHT HAND   Final   Special Requests BOTTLES DRAWN AEROBIC AND ANAEROBIC 10CC EACH   Final   Culture  Setup Time     Final   Value: 06/25/2013 16:16     Performed at [facility]   Culture      Final   Value:        BLOOD CULTURE RECEIVED NO GROWTH TO DATE CULTURE WILL BE HELD FOR 5 DAYS BEFORE ISSUING A  FINAL NEGATIVE REPORT     Performed at [facility]   Report Status PENDING   Incomplete  MRSA PCR SCREENING     Status: Abnormal   Collection Time    06/25/13 10:32 AM      Result Value Range Status   MRSA by PCR POSITIVE (*) NEGATIVE Final   Comment:            The GeneXpert MRSA Assay (FDA     approved for NASAL specimens     only), is one component of a     comprehensive MRSA colonization     surveillance program. It is not     intended to diagnose MRSA     infection nor to guide or     monitor treatment for     MRSA infections.     RESULT CALLED TO, READ BACK BY AND VERIFIED WITH:     AGUIRRI,A RN 06/25/13 1304 WOOTEN,K     Studies: Dg Bone Survey Met  06/29/2013   *RADIOLOGY REPORT*  Clinical Data: Abnormal MRI, evaluate for myeloma  METASTATIC BONE SURVEY  Comparison: MR lumbar spine of 06/25/2013  Findings: The lateral view of the skull shows no radiolucent lesion.  No bony abnormality is seen.  The cervical vertebrae are straightened in alignment.  The bones appear somewhat osteopenic.  There are diffuse degenerative changes noted with anterior osteophytes and loss of disc spaces C4-5. There appears to be fusion at the C5-6 level.  Views of the thoracic spine shows slight thoracic kyphosis and diffuse degenerative change.  No compression deformity is seen.  The lumbar vertebrae are straightened in alignment.  Diffuse degenerative change is noted.  No compression deformity is seen.  A view of the pelvis shows no definite sacral lesion.  There is significant degenerative osteophyte formation on the left at the L5- S1 level which could account for some of the abnormality noted on MRI.  Both hips are in normal position with mild degenerative change.  Views of both femurs show no radiolucent lesion.  Views of the right shoulder and right humerus show degenerative  change of the right shoulder most typical of chronic rotator cuff disease as well as degenerative joint disease involving the shoulder.  However no radiolucent lesion is seen.  Views of the left shoulder showed moderate degenerative joint disease of the left shoulder with less involvement of the rotator cuff noted on the right.  There is degenerative change at the left [facility] joint.  No radiolucent lesion is seen.  A frontal chest x-ray shows the lungs be clear.  Mediastinal contours appear normal.  The heart is within normal limits in size. The bones appear osteopenic with degenerative changes throughout the thoracic spine.  IMPRESSION:  1.  No radiographic evidence of multiple myeloma is seen. 2.  Diffuse degenerative change. 3.  The abnormality noted by MR involving the left sacrum may represent significant osteophyte formation at the L5 S1 level on the left.  No definite sacral lesion is identified by plain film.   Original Report Authenticated By: Dwyane Dee, M.D.    Scheduled Meds: .  ceFAZolin (ANCEF) IV  1 g Intravenous Q8H  . Chlorhexidine Gluconate Cloth  6 each Topical Q0600  . enoxaparin (LOVENOX) injection  40 mg Subcutaneous Daily  . feeding supplement  237 mL Oral BID BM  . insulin aspart  0-5 Units Subcutaneous QHS  . insulin aspart  0-9 Units Subcutaneous TID WC  . mupirocin ointment  1 application  Nasal BID  . polyethylene glycol  17 g Oral BID  . senna-docusate  1 tablet Oral BID  . simvastatin  20 mg Oral Daily  . sodium chloride  3 mL Intravenous Q12H   Continuous Infusions: . sodium chloride 100 mL/hr (06/29/13 1245)    Principal Problem:   Sepsis Active Problems:   UTI (lower urinary tract infection)   Ureteral obstruction, left   Acute renal failure    Time spent: 35 minutes.     Jeralyn Bennett  [facility] Pager [phone]. If 7PM-7AM, please contact night-coverage at [email or web address], password [facility] 06/29/2013, 5:11 PM  LOS: 4 days

## 2013-06-30 LAB — MULTIPLE MYELOMA PANEL, SERUM
Albumin ELP: 30.7 % — ABNORMAL LOW (ref 55.8–66.1)
Alpha-1-Globulin: 10.4 % — ABNORMAL HIGH (ref 2.9–4.9)
Alpha-2-Globulin: 18.1 % — ABNORMAL HIGH (ref 7.1–11.8)
Gamma Globulin: 26.1 % — ABNORMAL HIGH (ref 11.1–18.8)
IgG (Immunoglobin G), Serum: 1790 mg/dL — ABNORMAL HIGH (ref 650–1600)
IgM, Serum: 121 mg/dL (ref 41–251)

## 2013-06-30 LAB — GLUCOSE, CAPILLARY
Glucose-Capillary: 234 mg/dL — ABNORMAL HIGH (ref 70–99)
Glucose-Capillary: 297 mg/dL — ABNORMAL HIGH (ref 70–99)

## 2013-06-30 LAB — BETA 2 MICROGLOBULIN, SERUM: Beta-2 Microglobulin: 5.13 mg/L — ABNORMAL HIGH (ref 1.01–1.73)

## 2013-06-30 MED ORDER — LIDOCAINE 5 % EX PTCH
1.0000 | MEDICATED_PATCH | CUTANEOUS | Status: DC
  Administered 2013-06-30 – 2013-07-04 (×5): 1 via TRANSDERMAL
  Filled 2013-06-30 (×5): qty 1

## 2013-06-30 NOTE — Progress Notes (Signed)
[facility] PROGRESS NOTE  Miguel Leblanc ZOX:[id number] DOB: Aug 11, 1946 DOA: 06/25/2013 PCP: Pcp Not In System  Miguel Leblanc is a 67 year old gentleman with a past medical history of recurrent nephrolithiasis, chronic anemia requiring blood transfusions in the past, chronic right lower extremity ulcer, who presented to the emergent apartment on 06/25/2013 with complaints of severe lower back pain. He had reported passing small black stones in his urine over the past 3 weeks prior to this hospitalization. In the emergency department he was found to be hypotensive with systolic blood pressures in the 80s, a CT scan of abdomen and pelvis showed a punctate-sized stone in the distal left ureter with moderate proximal obstruction. There was evidence suggesting cystitis. Patient was evaluated by urology during this hospitalization. Patient to undergo cystoscopy and biopsy as an outpatient, Dr. Annabell Howells  recommending him to be on Bactrim DS by mouth each bedtime for suppression of UTI post discharge. His urine grew greater than 100,000 colony-forming units of Staphylococcus aureus. Dr. Ninetta Lights of infectious disease was consulted. He recommended 7 days of Ancef followed by Bactrim until he undergoes his urologic procedure.  With regard to his back pain an MRI showed lumbar spine some spondylosis and degenerative disc disease causing prominent impingement at the L4-L5 and moderate impingement at the L3 and L4 as well as L5-S1. There is a small nonspecific lesion in the S3 vertebral body which could be a small chondral vascular lesion, not seen at 2008. Radiology reporting malignancy could not be excluded. Dr. Cyndie Chime of medical oncology was consulted. [name] also having a history of chronic anemia. It seems that he has required blood transfusions in the past. He recommended oral iron replacement, as well as checking a 24-hour urine for total protein, creatinine clearance, and immunofixation electrophoresis for  further evaluation of hypoalbuminemia.                         Assessment/Plan:  Sepsis  Present on admission evidence by hypotension, mild lactic acidosis, tachycardia, with a source of infection suspected to be urinary tract.  UA showing pyuria, and cultures positive for Staph. Infectious disease consulted. He was given 2 days of IV ceftriaxone initially, now on cefazolin IV. Patient remaining afebrile. Planning for 7 days of Ancef.  Obstructive Uropathy/ Pyelonephritis:  Imaging showing left ureter with moderate proximal obstruction, and nodular prostate gland.  Dr Wilson Singer of urology is planning cystoscopy with RTG's and prostate [place] with biopsy at [facility], outpatient procedure on 9/23. Patient on Cefzil IV.  Acute renal failure Likely secondary to obstructive uropathy. Kidney function improved.   Diabetes: Had episodes of hypoglycemia, for which lantus was held. Continue with SSI.   Anemia   Patient was transfused 1 unit PRBC on 9/8. I have consulted Dr Cyndie Chime of heme/onc consulted. Have ordered a myeloma panel. Patient with history of chronic anemia, had seen hematologist in [place]. I discussed case with Cheree Ditto Dr. Cheree Ditto, findings on MRI unlikely to be malignancy.   Chronic Right LE ulcer: no significant drainage, wound care consulted.   Severe Back Pain  MRI showing extensive degenerative disc disease. Patient would likely benefit from physical therapy. I have ordered a lidocaine patch 12 hours on 12 hours off today to see if this helps.  Disposition Plan to transition patient to skilled nursing facility for rehabilitation. Awaiting for PASSR, referrals have been made.   Code Status: Full Code.  Family Communication: Care discussed with Patient.  Disposition Plan:  Social work consult, plan for transition to skilled nursing facility for rehabilitation   Consultants:  Urology  Infectious disease  Hematology  Antibiotics:  Ceftriaxone 9-8  (discontinued)  Vancomycin 9-8 (discontinued)  Cephazolin 1g IV q 8 hours   Objective: Filed Vitals:   06/30/13 0500  BP: 151/79  Pulse: 88  Temp: 97.9 F (36.6 C)  Resp: 20    Intake/Output Summary (Last 24 hours) at 06/30/13 1102 Last data filed at 06/30/13 0900  Gross per 24 hour  Intake    420 ml  Output   1100 ml  Net   -680 ml   Filed Weights   06/26/13 0419 06/27/13 0457 06/27/13 2219  Weight: 50.3 kg (110 lb 14.3 oz) 50 kg (110 lb 3.7 oz) 57.2 kg (126 lb 1.7 oz)    Exam:   General:  No distress.   Cardiovascular: S 1, S 2 RRR  Respiratory: CTA  Abdomen: BS present, soft, nt  Musculoskeletal: patient reporting pain to palpation over the lower back region  Skin: Chronic ulceration noted over the shin of right lower extremity.    Data Reviewed: Basic Metabolic Panel:  Recent Labs Lab 06/25/13 0437 06/25/13 1100 06/26/13 0525 06/27/13 0452 06/28/13 0450 06/29/13 0615  NA 133*  --  132* 132* 134* 132*  K 4.3  --  4.7 5.1 4.9 5.0  CL 98  --  103 103 105 101  CO2 24  --  23 23 23 22   GLUCOSE 127*  --  76 176* 241* 283*  BUN 39*  --  27* 25* 24* 21  CREATININE 1.64* 1.34 1.31 1.23 1.14 1.11  CALCIUM 8.7  --  8.5 8.4 8.4 8.8   Liver Function Tests:  Recent Labs Lab 06/25/13 0437 06/26/13 0525 06/28/13 1606  AST 12 11  --   ALT 6 5  --   ALKPHOS 105 110  --   BILITOT 0.1* 0.2*  --   PROT 6.9 6.3 6.4  ALBUMIN 1.9* 1.6*  --    No results found for this basename: LIPASE, AMYLASE,  in the last 168 hours No results found for this basename: AMMONIA,  in the last 168 hours CBC:  Recent Labs Lab 06/25/13 0437 06/25/13 1100 06/26/13 0525 06/27/13 0452 06/28/13 0450 06/29/13 0615  WBC 10.5 10.6* 9.3 8.2 7.1 6.1  NEUTROABS 9.1*  --   --   --   --   --   HGB 7.0* 8.2* 6.6* 7.6* 7.7* 8.6*  HCT 22.4* 25.9* 21.0* 24.0* 24.6* 28.3*  MCV 80.0 79.9 80.8 82.2 82.8 83.7  PLT 305 394 301 272 281 281   Cardiac Enzymes: No results found for  this basename: CKTOTAL, CKMB, CKMBINDEX, TROPONINI,  in the last 168 hours BNP (last 3 results) No results found for this basename: PROBNP,  in the last 8760 hours CBG:  Recent Labs Lab 06/29/13 0746 06/29/13 1153 06/29/13 1721 06/29/13 2204 06/30/13 0800  GLUCAP 258* 300* 190* 124* 234*    Recent Results (from the past 240 hour(s))  URINE CULTURE     Status: None   Collection Time    06/25/13  5:02 AM      Result Value Range Status   Specimen Description URINE, CLEAN CATCH   Final   Special Requests NONE   Final   Culture  Setup Time     Final   Value: 06/25/2013 06:00     Performed at [facility] Count     Final  Value: >=100,000 COLONIES/ML     Performed at [facility]   Culture     Final   Value: STAPHYLOCOCCUS AUREUS     Note: RIFAMPIN AND GENTAMICIN SHOULD NOT BE USED AS SINGLE DRUGS FOR TREATMENT OF STAPH INFECTIONS.     Performed at [facility]   Report Status 06/27/2013 FINAL   Final   Organism ID, Bacteria STAPHYLOCOCCUS AUREUS   Final  CULTURE, BLOOD (ROUTINE X 2)     Status: None   Collection Time    06/25/13  8:25 AM      Result Value Range Status   Specimen Description BLOOD RIGHT ARM   Final   Special Requests BOTTLES DRAWN AEROBIC AND ANAEROBIC 10CC EACH   Final   Culture  Setup Time     Final   Value: 06/25/2013 16:16     Performed at [facility]   Culture     Final   Value:        BLOOD CULTURE RECEIVED NO GROWTH TO DATE CULTURE WILL BE HELD FOR 5 DAYS BEFORE ISSUING A FINAL NEGATIVE REPORT     Performed at [facility]   Report Status PENDING   Incomplete  CULTURE, BLOOD (ROUTINE X 2)     Status: None   Collection Time    06/25/13  8:40 AM      Result Value Range Status   Specimen Description BLOOD RIGHT HAND   Final   Special Requests BOTTLES DRAWN AEROBIC AND ANAEROBIC 10CC EACH   Final   Culture  Setup Time     Final   Value: 06/25/2013 16:16     Performed at [facility]    Culture     Final   Value:        BLOOD CULTURE RECEIVED NO GROWTH TO DATE CULTURE WILL BE HELD FOR 5 DAYS BEFORE ISSUING A FINAL NEGATIVE REPORT     Performed at [facility]   Report Status PENDING   Incomplete  MRSA PCR SCREENING     Status: Abnormal   Collection Time    06/25/13 10:32 AM      Result Value Range Status   MRSA by PCR POSITIVE (*) NEGATIVE Final   Comment:            The GeneXpert MRSA Assay (FDA     approved for NASAL specimens     only), is one component of a     comprehensive MRSA colonization     surveillance program. It is not     intended to diagnose MRSA     infection nor to guide or     monitor treatment for     MRSA infections.     RESULT CALLED TO, READ BACK BY AND VERIFIED WITH:     AGUIRRI,A RN 06/25/13 1304 WOOTEN,K     Studies: Dg Bone Survey Met  06/29/2013   *RADIOLOGY REPORT*  Clinical Data: Abnormal MRI, evaluate for myeloma  METASTATIC BONE SURVEY  Comparison: MR lumbar spine of 06/25/2013  Findings: The lateral view of the skull shows no radiolucent lesion.  No bony abnormality is seen.  The cervical vertebrae are straightened in alignment.  The bones appear somewhat osteopenic.  There are diffuse degenerative changes noted with anterior osteophytes and loss of disc spaces C4-5. There appears to be fusion at the C5-6 level.  Views of the thoracic spine shows slight thoracic kyphosis and diffuse degenerative change.  No compression deformity is seen.  The lumbar vertebrae are straightened in alignment.  Diffuse degenerative change is noted.  No compression deformity is seen.  A view of the pelvis shows no definite sacral lesion.  There is significant degenerative osteophyte formation on the left at the L5- S1 level which could account for some of the abnormality noted on MRI.  Both hips are in normal position with mild degenerative change.  Views of both femurs show no radiolucent lesion.  Views of the right shoulder and right humerus show  degenerative change of the right shoulder most typical of chronic rotator cuff disease as well as degenerative joint disease involving the shoulder.  However no radiolucent lesion is seen.  Views of the left shoulder showed moderate degenerative joint disease of the left shoulder with less involvement of the rotator cuff noted on the right.  There is degenerative change at the left [facility] joint.  No radiolucent lesion is seen.  A frontal chest x-ray shows the lungs be clear.  Mediastinal contours appear normal.  The heart is within normal limits in size. The bones appear osteopenic with degenerative changes throughout the thoracic spine.  IMPRESSION:  1.  No radiographic evidence of multiple myeloma is seen. 2.  Diffuse degenerative change. 3.  The abnormality noted by MR involving the left sacrum may represent significant osteophyte formation at the L5 S1 level on the left.  No definite sacral lesion is identified by plain film.   Original Report Authenticated By: Dwyane Dee, M.D.    Scheduled Meds: .  ceFAZolin (ANCEF) IV  1 g Intravenous Q8H  . enoxaparin (LOVENOX) injection  40 mg Subcutaneous Daily  . feeding supplement  237 mL Oral BID BM  . insulin aspart  0-5 Units Subcutaneous QHS  . insulin aspart  0-9 Units Subcutaneous TID WC  . lidocaine  1 patch Transdermal Q24H  . polyethylene glycol  17 g Oral BID  . senna-docusate  1 tablet Oral BID  . simvastatin  20 mg Oral Daily  . sodium chloride  3 mL Intravenous Q12H   Continuous Infusions: . sodium chloride 100 mL/hr at 06/29/13 2327    Principal Problem:   Sepsis Active Problems:   UTI (lower urinary tract infection)   Ureteral obstruction, left   Acute renal failure    Time spent: 35 minutes.     Jeralyn Bennett  [facility] Pager [phone]. If 7PM-7AM, please contact night-coverage at [email or web address], password [facility] 06/30/2013, 11:02 AM  LOS: 5 days

## 2013-06-30 NOTE — Progress Notes (Signed)
Inpatient Diabetes Program Recommendations  AACE/ADA: New Consensus Statement on Inpatient Glycemic Control (2013)  Target Ranges:  Prepandial:   less than 140 mg/dL      Peak postprandial:   less than 180 mg/dL (1-2 hours)      Critically ill patients:  140 - 180 mg/dL   Results for WEBSTER, PATRONE (MRN [id number]) as of 06/30/2013 10:57  Ref. Range 06/29/2013 07:46 06/29/2013 11:53 06/29/2013 17:21 06/29/2013 22:04 06/30/2013 08:00  Glucose-Capillary Latest Range: 70-99 mg/dL 295 (H) 621 (H) 308 (H) 124 (H) 234 (H)   Inpatient Diabetes Program Recommendations Insulin - Basal: Please order basal insulin; recommend starting with Lantus 10 units QHS. HgbA1C: Please consider ordering an A1C to determine glycemic control over the past 2-3 months.  Note: Noted that patient had blood glucose of 47 on 9/7 @ 13:44 shortly after presenting to the ED on 9/7.  Noted low glucose likely due to NPO and effect of Lantus taken at home prior to presentation to ED.  Blood glucose has been consistently elevated over the past 3 days.  On 9/9 blood glucose ranged from 176-415 mg/dl and patient received a total of Novolog 24 units correction.  On 9/10 blood glucose ranged from 220-284 mg/dl and patient received a total of Novolog 13 units correction.  On 9/11 blood glucose ranged from 124-300 mg/dl and patient received a total of Novolog 12 units correction.  Please consider ordering basal insulin; recommend starting with Lantus 10 units QHS.  Also, please consider ordering an A1C as the last A1C in the chart was 10.5% on 09/30/2007.  Will continue to follow.  Thanks, Orlando Penner, RN, MSN, CCRN Diabetes Coordinator Inpatient Diabetes Program [phone] (Team Pager) [phone] ([place] office) [phone] [facility] office)

## 2013-06-30 NOTE — Progress Notes (Signed)
I have reviewed this note and agree with all findings. Kati Harika Laidlaw, PT, DPT Pager: [phone]   
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
## 2013-06-30 NOTE — Consult Note (Signed)
Referring MD: [facility] Dr Rinaldo Ratel  PCP:  Dr Rosemarie Beath, [place] Hematologist:  Dr Gerarda Fraction   Reason for Referral: Anemia, back pain, abnormal marrow signal on MRI  Chief Complaint  Patient presents with  . Flank Pain    HPI:  67 year old man who presented on the day of admission with a three-week history of recurrent nephrolithiasis. Main symptom was intermittent inability to pass urine relieved after he passed kidney stones. About 2 days prior to admission he developed acute onset of severe low back pain. No history of trauma. He has a hard time correlating his back pain with his urinary tract symptoms. He was found to have a microcytic anemia with hemoglobin 8.2 which after hydration fell to 6.6, mild leukocytosis white count 10,600, and thrombocytosis platelet count 394,000 on admission September 7. Reticulocyte count 1.4%. As part of evaluation of his back pain, MRI scan was done. 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 He has advanced spinal stenosis particularly at the level LIV-5. Radiologist read abnormal marrow signal in the lumbar area. No destructive changes of the bones seen on MRI or subsequent skeletal survey. No gross changes that would be consistent with osteomyelitis mentioned on the MRI. Of note the patient was admitted here in 2008. He was found to have an identical degree of anemia at that time. On 09/30/2007 hemoglobin 7.8 with MCV 82. Ferritin was 883. Serum iron 13 with saturation 7%. Apparently he was lost to followup. He had a recent outpatient evaluation of anemia in [place] where he lives. His primary care physician referred him for upper and lower endoscopy. Patient states that results were normal. I did obtain some progress notes from [facility] but not documentation of the endoscopy results. He was recently referred by his primary care to a hematologist in [place]. He was given a single unit blood transfusion for  hemoglobin 6.6. He was then given a dose of parenteral iron 1020 mg on September 2. A serum protein electrophoresis showed a polyclonal pattern. B12 and folic acid levels were normal. Serum iron 25 saturation 12% TIBC 210 Serum ferritin was 262. Coombs test was negative. LDH normal at 172. Then on 06/06/2013. Vitamin studies again normal. Haptoglobin elevated at 448. Serum erythropoietin level slightly above the reference range at 19.5 (2.6-18.5). Of note he is a insulin-dependent diabetic. He has had a nonhealing ulcer on his right ankle and is going to the wound center for treatment. This is a source of chronic inflammation and will spuriously elevate the ferritin. At time of this admission, due to his urinary symptoms, cultures were obtained and showed that he is growing greater than 100,000 colonies of staph aureus. No obvious source for this infection. Infectious disease consultation has been obtained. He is currently on antistaphylococcal antibiotics.     Past Medical History  Diagnosis Date  . Diabetes mellitus without complication   . Hypertension   . Kidney stones     History reviewed. No pertinent past surgical history.:  .  ceFAZolin (ANCEF) IV  1 g Intravenous Q8H  . enoxaparin (LOVENOX) injection  40 mg Subcutaneous Daily  . feeding supplement  237 mL Oral BID BM  . insulin aspart  0-5 Units Subcutaneous QHS  . insulin aspart  0-9 Units Subcutaneous TID WC  . polyethylene glycol  17 g Oral BID  . senna-docusate  1 tablet Oral BID  . simvastatin  20 mg Oral Daily  . sodium chloride  3 mL Intravenous Q12H  :  No Known Allergies:  History reviewed. No pertinent family history.: No family history of any bleeding disorders.   History   Social History  . Marital Status: Married    Spouse Name: N/A    Number of Children:  1 stepdaughter who transports him to the doctor.   . Years of Education: N/A   Occupational History  .  he is a retired Psychologist, occupational.    Social History  Main Topics  . Smoking status: Never Smoker   . Smokeless tobacco: Not on file  . Alcohol Use: No  . Drug Use: No  . Sexual Activity: Not on file   Other Topics Concern  . Not on file   Social History Narrative  . No narrative on file  :  ROS: Eyes: No change in vision Throat: No sore throat Neck: No stiff neck Resp: No cough or dyspnea  Cardio: No chest pain chest pressure or palpitations GI: He has had intermittent diarrhea for the last few days prior to admission Extremities: No swelling Lymph nodes:  Neurologic:  See above Skin: . See above Genitourinary: See above   Vitals: Filed Vitals:   06/30/13 0500  BP: 151/79  Pulse: 88  Temp: 97.9 F (36.6 C)  Resp: 20    PHYSICAL EXAM: General appearance: Unkempt chronically ill-appearing man HEENT: Horrible dentition with multiple broken teeth, pharynx no erythema or exudate  Lymph Nodes: No cervical, supraclavicular, or axillary adenopathy. Resp: Lungs clear to auscultation resonant to percussion Cardio: Regular rhythm no murmur Vascular: No cyanosis, no carotid bruits Breasts: GI: Abdomen soft, nontender, no mass, no organomegaly GU: Foley catheter in place. Clear urine  Extremities: No edema, no calf tenderness  Neurologic: He is alert and oriented, cranial nerves grossly normal, motor strength 5 over 5, he can straight leg raise with both lower extremities. Reflexes 2+ symmetric at the knees, 1+ symmetric at the biceps, sensation intact to touch. Skin: Nonhealing ulcer medial right ankle.  Labs:   Recent Labs  06/28/13 0450 06/29/13 0615  WBC 7.1 6.1  HGB 7.7* 8.6*  HCT 24.6* 28.3*  PLT 281 281  . White count differential: Not done.   Recent Labs  06/28/13 0450 06/29/13 0615  NA 134* 132*  K 4.9 5.0  CL 105 101  CO2 23 22  GLUCOSE 241* 283*  BUN 24* 21  CREATININE 1.14 1.11  CALCIUM 8.4 8.8    Blood smear review: Pending  Images Studies/Results:  Dg Bone Survey Met  06/29/2013    *RADIOLOGY REPORT*  Clinical Data: Abnormal MRI, evaluate for myeloma  METASTATIC BONE SURVEY  Comparison: MR lumbar spine of 06/25/2013  Findings: The lateral view of the skull shows no radiolucent lesion.  No bony abnormality is seen.  The cervical vertebrae are straightened in alignment.  The bones appear somewhat osteopenic.  There are diffuse degenerative changes noted with anterior osteophytes and loss of disc spaces C4-5. There appears to be fusion at the C5-6 level.  Views of the thoracic spine shows slight thoracic kyphosis and diffuse degenerative change.  No compression deformity is seen.  The lumbar vertebrae are straightened in alignment.  Diffuse degenerative change is noted.  No compression deformity is seen.  A view of the pelvis shows no definite sacral lesion.  There is significant degenerative osteophyte formation on the left at the L5- S1 level which could account for some of the abnormality noted on MRI.  Both hips are in normal position with mild degenerative change.  Views of both  femurs show no radiolucent lesion.  Views of the right shoulder and right humerus show degenerative change of the right shoulder most typical of chronic rotator cuff disease as well as degenerative joint disease involving the shoulder.  However no radiolucent lesion is seen.  Views of the left shoulder showed moderate degenerative joint disease of the left shoulder with less involvement of the rotator cuff noted on the right.  There is degenerative change at the left [facility] joint.  No radiolucent lesion is seen.  A frontal chest x-ray shows the lungs be clear.  Mediastinal contours appear normal.  The heart is within normal limits in size. The bones appear osteopenic with degenerative changes throughout the thoracic spine.  IMPRESSION:  1.  No radiographic evidence of multiple myeloma is seen. 2.  Diffuse degenerative change. 3.  The abnormality noted by MR involving the left sacrum may represent significant osteophyte  formation at the L5 S1 level on the left.  No definite sacral lesion is identified by plain film.   Original Report Authenticated By: Dwyane Dee, M.D.     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 He has microcytic indices with an elevated ferritin. Ferritin likely spuriously high due to the acute phase reaction from acute and chronic infection and therefore difficult to used to assess iron stores. I suspect we may be dealing with iron malabsorption. He recently received a dose of parenteral iron as an outpatient on September 2. It would take approximately 4-6 weeks to see the effect of this treatment. Recent GI evaluation negative per his history and in the references made in physician notes from [facility] [facility] where he had a recent evaluation. Another possibility to explain microcytosis with elevated ferritin in an older individual would be an underlying myelodysplastic syndrome. If he did not have an adequate response of parenteral iron, he should then have further evaluation with a bone marrow aspiration biopsy and cytogenetic studies. Despite nonspecific changes reported on his MRI scan, there is no evidence that he has underlying multiple myeloma. There are no lytic lesions on skeletal survey. 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 He does have significant hypoalbuminemia but states that he eats well. It might be worthwhile to check a 24-hour urine for total protein, creatinine clearance, and immunofixation electrophoresis. Creatinine was 1.3 on admission but has improved currently 1.1 following hydration. Etiology of his acute  back pain is not clear. I would still be concerned with early staphylococcal osteomyelitis of the spine. I will leave further evaluation to infectious disease consultant. Per my discussion with the hospital attending physician, a neurosurgeon did evaluate the patient and did not think further intervention was necessary at this time. I cannot find a note to document this. The patient does have severe spinal stenosis in the lumbar area along with associated advanced degenerative changes which may be the etiology of his pain. He has no focal neurologic deficits at this time that I can detect on my exam.   Recommendation: See discussion above. I would send him home on oral iron replacement. As noted, he recently received a dose of parenteral iron on September 2. I would check a 24-hour urine for total protein,  creatinine clearance, and immunofixation electrophoresis for further evaluation of his hypoalbuminemia. Obviously, continue treatment of his staphylococcal urinary tract infection and followup with urology for his recurrent nephrolithiasis. He is already established with an Administrator, arts and a hematologist in [place]. Please send a copy of his discharge summary to these doctors. He had a followup appointment scheduled with the hematologist this week. Please reschedule this.  Thank you for this consultation    Chirsty Armistead M 06/30/2013, 7:09 AM

## 2013-07-01 LAB — CBC
HCT: 28.3 % — ABNORMAL LOW (ref 39.0–52.0)
MCH: 25.9 pg — ABNORMAL LOW (ref 26.0–34.0)
MCV: 83.2 fL (ref 78.0–100.0)
RDW: 17.1 % — ABNORMAL HIGH (ref 11.5–15.5)
WBC: 4.6 10*3/uL (ref 4.0–10.5)

## 2013-07-01 LAB — GLUCOSE, CAPILLARY
Glucose-Capillary: 320 mg/dL — ABNORMAL HIGH (ref 70–99)
Glucose-Capillary: 320 mg/dL — ABNORMAL HIGH (ref 70–99)
Glucose-Capillary: 375 mg/dL — ABNORMAL HIGH (ref 70–99)

## 2013-07-01 LAB — CULTURE, BLOOD (ROUTINE X 2): Culture: NO GROWTH

## 2013-07-01 MED ORDER — HYDROCODONE-ACETAMINOPHEN 5-325 MG PO TABS
ORAL_TABLET | ORAL | Status: AC
  Filled 2013-07-01: qty 2

## 2013-07-01 MED ORDER — HYDRALAZINE HCL 20 MG/ML IJ SOLN
10.0000 mg | Freq: Once | INTRAMUSCULAR | Status: AC
  Administered 2013-07-01: 10 mg via INTRAVENOUS
  Filled 2013-07-01: qty 1

## 2013-07-01 NOTE — Progress Notes (Signed)
[facility] PROGRESS NOTE  Miguel Leblanc RUE:[id number] DOB: 15-Dec-1945 DOA: 06/25/2013 PCP: Pcp Not In System  Miguel Leblanc is a 67 year old gentleman with a past medical history of recurrent nephrolithiasis, chronic anemia requiring blood transfusions in the past, chronic right lower extremity ulcer, who presented to the emergent apartment on 06/25/2013 with complaints of severe lower back pain. He had reported passing small black stones in his urine over the past 3 weeks prior to this hospitalization. In the emergency department he was found to be hypotensive with systolic blood pressures in the 80s, a CT scan of abdomen and pelvis showed a punctate-sized stone in the distal left ureter with moderate proximal obstruction. There was evidence suggesting cystitis. Patient was evaluated by urology during this hospitalization. Patient to undergo cystoscopy and biopsy as an outpatient, Dr. Annabell Howells  recommending him to be on Bactrim DS by mouth each bedtime for suppression of UTI post discharge. His urine grew greater than 100,000 colony-forming units of Staphylococcus aureus. Dr. Ninetta Lights of infectious disease was consulted. He recommended 7 days of Ancef followed by Bactrim until he undergoes his urologic procedure.  With regard to his back pain an MRI showed lumbar spine some spondylosis and degenerative disc disease causing prominent impingement at the L4-L5 and moderate impingement at the L3 and L4 as well as L5-S1. There is a small nonspecific lesion in the S3 vertebral body which could be a small chondral vascular lesion, not seen at 2008. Radiology reporting malignancy could not be excluded. Dr. Cyndie Chime of medical oncology was consulted. [name] also having a history of chronic anemia. It seems that he has required blood transfusions in the past. He recommended oral iron replacement, as well as checking a 24-hour urine for total protein, creatinine clearance, and immunofixation electrophoresis for  further evaluation of hypoalbuminemia.                         Assessment/Plan:  Sepsis  Present on admission evidence by hypotension, mild lactic acidosis, tachycardia, with a source of infection suspected to be urinary tract.  UA showing pyuria, and cultures positive for Staph. Infectious disease consulted. He was given 2 days of IV ceftriaxone initially, now on cefazolin IV. Patient remaining afebrile. Planning for 7 days of Ancef.  Today is day 5/7  Obstructive Uropathy/ Pyelonephritis:  Imaging showing left ureter with moderate proximal obstruction, and nodular prostate gland.  Dr Wilson Singer of urology is planning cystoscopy with RTG's and prostate [place] with biopsy at [facility], outpatient procedure on 9/23.   Acute renal failure Likely secondary to obstructive uropathy. Kidney function improved and on last check WNL  Diabetes: Had episodes of hypoglycemia, for which lantus was held. Continue with SSI.   Anemia   Patient was transfused 1 unit PRBC on 9/8. I have consulted Dr Cyndie Chime of heme/onc consulted. Have ordered a myeloma panel. Patient with history of chronic anemia, had seen hematologist in [facility]. I discussed case with Cheree Ditto Dr. Cheree Ditto, findings on MRI unlikely to be malignancy.   Chronic Right LE ulcer: no significant drainage, wound care consulted.   Severe Back Pain  MRI showing extensive degenerative disc disease. Patient would likely benefit from physical therapy. I have ordered a lidocaine patch 12 hours on 12 hours off today to see if this helps.  Disposition Plan to transition patient to skilled nursing facility for rehabilitation. Awaiting for PASSR, referrals have been made.   Code Status: Full Code.  Family Communication: Care  discussed with Patient.  Disposition Plan: Social work consult, plan for transition to skilled nursing facility for rehabilitation   Consultants:  Urology  Infectious  disease  Hematology  Antibiotics:  Ceftriaxone 9-8 (discontinued)  Vancomycin 9-8 (discontinued)  Cephazolin 1g IV q 8 hours   Objective: Filed Vitals:   07/01/13 1540  BP: 185/96  Pulse: 81  Temp: 97.4 F (36.3 C)  Resp: 18    Intake/Output Summary (Last 24 hours) at 07/01/13 1856 Last data filed at 07/01/13 0900  Gross per 24 hour  Intake   9200 ml  Output    900 ml  Net   8300 ml   Filed Weights   06/27/13 0457 06/27/13 2219  Weight: 50 kg (110 lb 3.7 oz) 57.2 kg (126 lb 1.7 oz)    Exam:   General:  In NAD, Alert and awake  Cardiovascular: S 1, S 2 RRR  Respiratory: CTA BL, no increased WOB  Abdomen: BS present, soft, nt  Musculoskeletal: no cyanosis or clubbing  Skin: Chronic ulceration noted over the shin of right lower extremity.    Data Reviewed: Basic Metabolic Panel:  Recent Labs Lab 06/25/13 0437 06/25/13 1100 06/26/13 0525 06/27/13 0452 06/28/13 0450 06/29/13 0615  NA 133*  --  132* 132* 134* 132*  K 4.3  --  4.7 5.1 4.9 5.0  CL 98  --  103 103 105 101  CO2 24  --  23 23 23 22   GLUCOSE 127*  --  76 176* 241* 283*  BUN 39*  --  27* 25* 24* 21  CREATININE 1.64* 1.34 1.31 1.23 1.14 1.11  CALCIUM 8.7  --  8.5 8.4 8.4 8.8   Liver Function Tests:  Recent Labs Lab 06/25/13 0437 06/26/13 0525 06/28/13 1606  AST 12 11  --   ALT 6 5  --   ALKPHOS 105 110  --   BILITOT 0.1* 0.2*  --   PROT 6.9 6.3 6.4  ALBUMIN 1.9* 1.6*  --    No results found for this basename: LIPASE, AMYLASE,  in the last 168 hours No results found for this basename: AMMONIA,  in the last 168 hours CBC:  Recent Labs Lab 06/25/13 0437  06/26/13 0525 06/27/13 0452 06/28/13 0450 06/29/13 0615 07/01/13 0704  WBC 10.5  < > 9.3 8.2 7.1 6.1 4.6  NEUTROABS 9.1*  --   --   --   --   --   --   HGB 7.0*  < > 6.6* 7.6* 7.7* 8.6* 8.8*  HCT 22.4*  < > 21.0* 24.0* 24.6* 28.3* 28.3*  MCV 80.0  < > 80.8 82.2 82.8 83.7 83.2  PLT 305  < > 301 272 281 281 283  < > =  values in this interval not displayed. Cardiac Enzymes: No results found for this basename: CKTOTAL, CKMB, CKMBINDEX, TROPONINI,  in the last 168 hours BNP (last 3 results) No results found for this basename: PROBNP,  in the last 8760 hours CBG:  Recent Labs Lab 06/30/13 1705 06/30/13 2149 07/01/13 0750 07/01/13 1224 07/01/13 1730  GLUCAP 327* 400* 320* 320* 375*    Recent Results (from the past 240 hour(s))  URINE CULTURE     Status: None   Collection Time    06/25/13  5:02 AM      Result Value Range Status   Specimen Description URINE, CLEAN CATCH   Final   Special Requests NONE   Final   Culture  Setup Time  Final   Value: 06/25/2013 06:00     Performed at [facility] Count     Final   Value: >=100,000 COLONIES/ML     Performed at [facility]   Culture     Final   Value: STAPHYLOCOCCUS AUREUS     Note: RIFAMPIN AND GENTAMICIN SHOULD NOT BE USED AS SINGLE DRUGS FOR TREATMENT OF STAPH INFECTIONS.     Performed at [facility]   Report Status 06/27/2013 FINAL   Final   Organism ID, Bacteria STAPHYLOCOCCUS AUREUS   Final  CULTURE, BLOOD (ROUTINE X 2)     Status: None   Collection Time    06/25/13  8:25 AM      Result Value Range Status   Specimen Description BLOOD RIGHT ARM   Final   Special Requests BOTTLES DRAWN AEROBIC AND ANAEROBIC 10CC EACH   Final   Culture  Setup Time     Final   Value: 06/25/2013 16:16     Performed at [facility]   Culture     Final   Value: NO GROWTH 5 DAYS     Performed at [facility]   Report Status 07/01/2013 FINAL   Final  CULTURE, BLOOD (ROUTINE X 2)     Status: None   Collection Time    06/25/13  8:40 AM      Result Value Range Status   Specimen Description BLOOD RIGHT HAND   Final   Special Requests BOTTLES DRAWN AEROBIC AND ANAEROBIC 10CC EACH   Final   Culture  Setup Time     Final   Value: 06/25/2013 16:16     Performed at [facility]   Culture     Final    Value: NO GROWTH 5 DAYS     Performed at [facility]   Report Status 07/01/2013 FINAL   Final  MRSA PCR SCREENING     Status: Abnormal   Collection Time    06/25/13 10:32 AM      Result Value Range Status   MRSA by PCR POSITIVE (*) NEGATIVE Final   Comment:            The GeneXpert MRSA Assay (FDA     approved for NASAL specimens     only), is one component of a     comprehensive MRSA colonization     surveillance program. It is not     intended to diagnose MRSA     infection nor to guide or     monitor treatment for     MRSA infections.     RESULT CALLED TO, READ BACK BY AND VERIFIED WITH:     AGUIRRI,A RN 06/25/13 1304 WOOTEN,K     Studies: No results found.  Scheduled Meds: .  ceFAZolin (ANCEF) IV  1 g Intravenous Q8H  . enoxaparin (LOVENOX) injection  40 mg Subcutaneous Daily  . feeding supplement  237 mL Oral BID BM  . insulin aspart  0-5 Units Subcutaneous QHS  . insulin aspart  0-9 Units Subcutaneous TID WC  . lidocaine  1 patch Transdermal Q24H  . polyethylene glycol  17 g Oral BID  . senna-docusate  1 tablet Oral BID  . simvastatin  20 mg Oral Daily  . sodium chloride  3 mL Intravenous Q12H   Continuous Infusions: . sodium chloride 100 mL/hr at 07/01/13 1610    Principal Problem:   Sepsis Active Problems:   UTI (lower urinary  tract infection)   Ureteral obstruction, left   Acute renal failure    Time spent: 35 minutes.     Penny Pia  [facility] Pager [phone]. If 7PM-7AM, please contact night-coverage at [email or web address], password [facility] 07/01/2013, 6:56 PM  LOS: 6 days

## 2013-07-02 DIAGNOSIS — E1110 Type 2 diabetes mellitus with ketoacidosis without coma: Secondary | ICD-10-CM

## 2013-07-02 DIAGNOSIS — E1310 Other specified diabetes mellitus with ketoacidosis without coma: Secondary | ICD-10-CM

## 2013-07-02 DIAGNOSIS — I10 Essential (primary) hypertension: Secondary | ICD-10-CM

## 2013-07-02 LAB — CREATININE, SERUM
Creatinine, Ser: 1.05 mg/dL (ref 0.50–1.35)
GFR calc Af Amer: 83 mL/min — ABNORMAL LOW (ref >90)

## 2013-07-02 MED ORDER — HYDRALAZINE HCL 20 MG/ML IJ SOLN
10.0000 mg | Freq: Four times a day (QID) | INTRAMUSCULAR | Status: DC | PRN
  Administered 2013-07-02: 19:00:00 10 mg via INTRAVENOUS
  Filled 2013-07-02: qty 1

## 2013-07-02 MED ORDER — INSULIN GLARGINE 100 UNIT/ML ~~LOC~~ SOLN
15.0000 [IU] | Freq: Every day | SUBCUTANEOUS | Status: DC
  Administered 2013-07-02 – 2013-07-04 (×2): 15 [IU] via SUBCUTANEOUS
  Filled 2013-07-02 (×3): qty 0.15

## 2013-07-02 MED ORDER — LISINOPRIL 5 MG PO TABS
5.0000 mg | ORAL_TABLET | Freq: Every day | ORAL | Status: DC
  Administered 2013-07-02 – 2013-07-04 (×3): 5 mg via ORAL
  Filled 2013-07-02 (×3): qty 1

## 2013-07-02 MED ORDER — CHLORTHALIDONE 25 MG PO TABS
25.0000 mg | ORAL_TABLET | Freq: Every day | ORAL | Status: DC
  Administered 2013-07-02 – 2013-07-04 (×3): 25 mg via ORAL
  Filled 2013-07-02 (×3): qty 1

## 2013-07-02 NOTE — Progress Notes (Signed)
[facility] PROGRESS NOTE  Miguel Leblanc ZOX:[id number] DOB: 18-Jan-1946 DOA: 06/25/2013 PCP: Pcp Not In System  Miguel Leblanc is a 67 year old gentleman with a past medical history of recurrent nephrolithiasis, chronic anemia requiring blood transfusions in the past, chronic right lower extremity ulcer, who presented to the emergent apartment on 06/25/2013 with complaints of severe lower back pain. He had reported passing small black stones in his urine over the past 3 weeks prior to this hospitalization. In the emergency department he was found to be hypotensive with systolic blood pressures in the 80s, a CT scan of abdomen and pelvis showed a punctate-sized stone in the distal left ureter with moderate proximal obstruction. There was evidence suggesting cystitis. Patient was evaluated by urology during this hospitalization. Patient to undergo cystoscopy and biopsy as an outpatient, Dr. Annabell Howells  recommending him to be on Bactrim DS by mouth each bedtime for suppression of UTI post discharge. His urine grew greater than 100,000 colony-forming units of Staphylococcus aureus. Dr. Ninetta Lights of infectious disease was consulted. He recommended 7 days of Ancef followed by Bactrim until he undergoes his urologic procedure.  With regard to his back pain an MRI showed lumbar spine some spondylosis and degenerative disc disease causing prominent impingement at the L4-L5 and moderate impingement at the L3 and L4 as well as L5-S1. There is a small nonspecific lesion in the S3 vertebral body which could be a small chondral vascular lesion, not seen at 2008. Radiology reporting malignancy could not be excluded. Dr. Cyndie Chime of medical oncology was consulted. [name] also having a history of chronic anemia. It seems that he has required blood transfusions in the past. He recommended oral iron replacement, as well as checking a 24-hour urine for total protein, creatinine clearance, and immunofixation electrophoresis for  further evaluation of hypoalbuminemia.                         Assessment/Plan:  Sepsis  Present on admission evidence by hypotension, mild lactic acidosis, tachycardia, with a source of infection suspected to be urinary tract.  UA showing pyuria, and cultures positive for Staph. Infectious disease consulted. He was given 2 days of IV ceftriaxone initially, now on cefazolin IV. Patient remaining afebrile. Planning for 7 days of Ancef.  Today is day 6/7  Obstructive Uropathy/ Pyelonephritis:  Imaging showing left ureter with moderate proximal obstruction, and nodular prostate gland.  Dr Wilson Singer of urology is planning cystoscopy with RTG's and prostate [place] with biopsy at [facility], outpatient procedure on 9/23.   Acute renal failure Likely secondary to obstructive uropathy. Kidney function improved and on last check WNL  Diabetes: Had episodes of hypoglycemia, for which lantus was held. As such patient was continued on SSI, but blood sugars are currently elevated again.  Will start lantus at 15 units QHS.  On review of patient's home regimen he was on 20-30 units at night.  Anemia   Patient was transfused 1 unit PRBC on 9/8. I have consulted Dr Cyndie Chime of heme/onc consulted. Myeloma panel ordered. Patient with history of chronic anemia, had seen hematologist in [facility]. I discussed case with Dr. Cheree Ditto, findings on MRI unlikely to be malignancy.   Chronic Right LE ulcer: no significant drainage, wound care consulted.   Severe Back Pain  MRI showing extensive degenerative disc disease. Patient would likely benefit from physical therapy. I have ordered a lidocaine patch 12 hours on 12 hours off today to see if this helps.  Disposition Plan to transition patient to skilled nursing facility for rehabilitation. Awaiting for PASSR, referrals have been made.  Code Status: Full Code.  Family Communication: Care discussed with Patient.  Disposition Plan: Social work  consult, plan for transition to skilled nursing facility for rehabilitation   Consultants:  Urology  Infectious disease  Hematology  Antibiotics:  Ceftriaxone 9-8 (discontinued)  Vancomycin 9-8 (discontinued)  Cephazolin 1g IV q 8 hours day 6/7   Objective: Filed Vitals:   07/02/13 1619  BP: 189/106  Pulse: 93  Temp: 98.1 F (36.7 C)  Resp: 20    Intake/Output Summary (Last 24 hours) at 07/02/13 1822 Last data filed at 07/02/13 1200  Gross per 24 hour  Intake 1573.33 ml  Output   3170 ml  Net -1596.67 ml   Filed Weights   06/27/13 0457 06/27/13 2219  Weight: 50 kg (110 lb 3.7 oz) 57.2 kg (126 lb 1.7 oz)    Exam:   General:  In NAD, Alert and awake  Cardiovascular: S 1, S 2 RRR  Respiratory: CTA BL, no increased WOB  Abdomen: BS present, soft, nt  Musculoskeletal: no cyanosis or clubbing  Skin: Chronic ulceration noted over the shin of right lower extremity.    Data Reviewed: Basic Metabolic Panel:  Recent Labs Lab 06/26/13 0525 06/27/13 0452 06/28/13 0450 06/29/13 0615 07/02/13 0600  NA 132* 132* 134* 132*  --   K 4.7 5.1 4.9 5.0  --   CL 103 103 105 101  --   CO2 23 23 23 22   --   GLUCOSE 76 176* 241* 283*  --   BUN 27* 25* 24* 21  --   CREATININE 1.31 1.23 1.14 1.11 1.05  CALCIUM 8.5 8.4 8.4 8.8  --    Liver Function Tests:  Recent Labs Lab 06/26/13 0525 06/28/13 1606  AST 11  --   ALT 5  --   ALKPHOS 110  --   BILITOT 0.2*  --   PROT 6.3 6.4  ALBUMIN 1.6*  --    No results found for this basename: LIPASE, AMYLASE,  in the last 168 hours No results found for this basename: AMMONIA,  in the last 168 hours CBC:  Recent Labs Lab 06/26/13 0525 06/27/13 0452 06/28/13 0450 06/29/13 0615 07/01/13 0704  WBC 9.3 8.2 7.1 6.1 4.6  HGB 6.6* 7.6* 7.7* 8.6* 8.8*  HCT 21.0* 24.0* 24.6* 28.3* 28.3*  MCV 80.8 82.2 82.8 83.7 83.2  PLT 301 272 281 281 283   Cardiac Enzymes: No results found for this basename: CKTOTAL, CKMB,  CKMBINDEX, TROPONINI,  in the last 168 hours BNP (last 3 results) No results found for this basename: PROBNP,  in the last 8760 hours CBG:  Recent Labs Lab 07/01/13 1730 07/01/13 2236 07/02/13 0817 07/02/13 1219 07/02/13 1620  GLUCAP 375* 270* 231* 353* 342*    Recent Results (from the past 240 hour(s))  URINE CULTURE     Status: None   Collection Time    06/25/13  5:02 AM      Result Value Range Status   Specimen Description URINE, CLEAN CATCH   Final   Special Requests NONE   Final   Culture  Setup Time     Final   Value: 06/25/2013 06:00     Performed at [facility] Count     Final   Value: >=100,000 COLONIES/ML     Performed at [facility]  Final   Value: STAPHYLOCOCCUS AUREUS     Note: RIFAMPIN AND GENTAMICIN SHOULD NOT BE USED AS SINGLE DRUGS FOR TREATMENT OF STAPH INFECTIONS.     Performed at [facility]   Report Status 06/27/2013 FINAL   Final   Organism ID, Bacteria STAPHYLOCOCCUS AUREUS   Final  CULTURE, BLOOD (ROUTINE X 2)     Status: None   Collection Time    06/25/13  8:25 AM      Result Value Range Status   Specimen Description BLOOD RIGHT ARM   Final   Special Requests BOTTLES DRAWN AEROBIC AND ANAEROBIC 10CC EACH   Final   Culture  Setup Time     Final   Value: 06/25/2013 16:16     Performed at [facility]   Culture     Final   Value: NO GROWTH 5 DAYS     Performed at [facility]   Report Status 07/01/2013 FINAL   Final  CULTURE, BLOOD (ROUTINE X 2)     Status: None   Collection Time    06/25/13  8:40 AM      Result Value Range Status   Specimen Description BLOOD RIGHT HAND   Final   Special Requests BOTTLES DRAWN AEROBIC AND ANAEROBIC 10CC EACH   Final   Culture  Setup Time     Final   Value: 06/25/2013 16:16     Performed at [facility]   Culture     Final   Value: NO GROWTH 5 DAYS     Performed at [facility]   Report Status 07/01/2013 FINAL   Final  MRSA  PCR SCREENING     Status: Abnormal   Collection Time    06/25/13 10:32 AM      Result Value Range Status   MRSA by PCR POSITIVE (*) NEGATIVE Final   Comment:            The GeneXpert MRSA Assay (FDA     approved for NASAL specimens     only), is one component of a     comprehensive MRSA colonization     surveillance program. It is not     intended to diagnose MRSA     infection nor to guide or     monitor treatment for     MRSA infections.     RESULT CALLED TO, READ BACK BY AND VERIFIED WITH:     AGUIRRI,A RN 06/25/13 1304 WOOTEN,K     Studies: No results found.  Scheduled Meds: .  ceFAZolin (ANCEF) IV  1 g Intravenous Q8H  . chlorthalidone  25 mg Oral Daily  . enoxaparin (LOVENOX) injection  40 mg Subcutaneous Daily  . feeding supplement  237 mL Oral BID BM  . insulin aspart  0-5 Units Subcutaneous QHS  . insulin aspart  0-9 Units Subcutaneous TID WC  . lidocaine  1 patch Transdermal Q24H  . lisinopril  5 mg Oral Daily  . polyethylene glycol  17 g Oral BID  . senna-docusate  1 tablet Oral BID  . simvastatin  20 mg Oral Daily  . sodium chloride  3 mL Intravenous Q12H   Continuous Infusions: . sodium chloride 100 mL/hr at 07/02/13 0800    Principal Problem:   Sepsis Active Problems:   UTI (lower urinary tract infection)   Ureteral obstruction, left   Acute renal failure    Time spent: 35 minutes.     Penny Pia  [facility]  [phone]. If 7PM-7AM, please contact night-coverage at [email or web address], password [facility] 07/02/2013, 6:22 PM  LOS: 7 days

## 2013-07-02 NOTE — Progress Notes (Signed)
[name] BP 210/107 HR 82. MD notified.  Will give hydralazine IV per MD order.

## 2013-07-03 LAB — GLUCOSE, CAPILLARY
Glucose-Capillary: 127 mg/dL — ABNORMAL HIGH (ref 70–99)
Glucose-Capillary: 233 mg/dL — ABNORMAL HIGH (ref 70–99)

## 2013-07-03 LAB — BASIC METABOLIC PANEL
BUN: 13 mg/dL (ref 6–23)
CO2: 28 mEq/L (ref 19–32)
Chloride: 103 mEq/L (ref 96–112)
Creatinine, Ser: 1.1 mg/dL (ref 0.50–1.35)
Glucose, Bld: 141 mg/dL — ABNORMAL HIGH (ref 70–99)

## 2013-07-03 MED ORDER — FERROUS SULFATE 325 (65 FE) MG PO TABS
325.0000 mg | ORAL_TABLET | Freq: Three times a day (TID) | ORAL | Status: DC
  Administered 2013-07-03 – 2013-07-04 (×4): 325 mg via ORAL
  Filled 2013-07-03 (×6): qty 1

## 2013-07-03 NOTE — Progress Notes (Signed)
CSW received call from Delphia Grates, Admissions Director- [facility].  She has received insurance authorization from [employer] starting tomorrow 07/04/13.  CSW left message with patient's daughter Larene Beach of tentative d/c tomorrow. CSW will follow up with patient/family in the a.m.  Lorri Frederick. [place]  [phone]
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
## 2013-07-03 NOTE — Progress Notes (Signed)
[facility] PROGRESS NOTE  Miguel Leblanc NWG:[id number] DOB: Feb 18, 1946 DOA: 06/25/2013 PCP: Pcp Not In System  Miguel Leblanc is a 67 year old gentleman with a past medical history of recurrent nephrolithiasis, chronic anemia requiring blood transfusions in the past, chronic right lower extremity ulcer, who presented to the emergent apartment on 06/25/2013 with complaints of severe lower back pain. He had reported passing small black stones in his urine over the past 3 weeks prior to this hospitalization. In the emergency department he was found to be hypotensive with systolic blood pressures in the 80s, a CT scan of abdomen and pelvis showed a punctate-sized stone in the distal left ureter with moderate proximal obstruction. There was evidence suggesting cystitis. Patient was evaluated by urology during this hospitalization. Patient to undergo cystoscopy and biopsy as an outpatient, Dr. Annabell Howells  recommending him to be on Bactrim DS by mouth each bedtime for suppression of UTI post discharge. His urine grew greater than 100,000 colony-forming units of Staphylococcus aureus. Dr. Ninetta Lights of infectious disease was consulted. He recommended 7 days of Ancef followed by Bactrim until he undergoes his urologic procedure.  With regard to his back pain an MRI showed lumbar spine some spondylosis and degenerative disc disease causing prominent impingement at the L4-L5 and moderate impingement at the L3 and L4 as well as L5-S1. There is a small nonspecific lesion in the S3 vertebral body which could be a small chondral vascular lesion, not seen at 2008. Radiology reporting malignancy could not be excluded. Dr. Cyndie Chime of medical oncology was consulted. [name] also having a history of chronic anemia. It seems that he has required blood transfusions in the past. He recommended oral iron replacement, as well as checking a 24-hour urine for total protein, creatinine clearance, and immunofixation electrophoresis for  further evaluation of hypoalbuminemia.                         Assessment/Plan:  Sepsis  Present on admission evidence by hypotension, mild lactic acidosis, tachycardia, with a source of infection suspected to be urinary tract.  UA showing pyuria, and cultures positive for Staph. Infectious disease consulted. He was given 2 days of IV ceftriaxone initially, now on cefazolin IV. Patient remaining afebrile. Planning for 7 days of Ancef.  Today is day 7/7 - sepsis resolved.  Obstructive Uropathy/ Pyelonephritis:  Imaging showing left ureter with moderate proximal obstruction, and nodular prostate gland.  Dr Wilson Singer of urology is planning cystoscopy with RTG's and prostate [place] with biopsy at [facility], outpatient procedure on 9/23.   Acute renal failure Likely secondary to obstructive uropathy. Kidney function improved and on last check WNL  Diabetes: Had episodes of hypoglycemia, for which lantus was held. As such patient was continued on SSI, but blood sugars are currently elevated again.  Will start lantus at 15 units QHS.  On review of patient's home regimen he was on 20-30 units at night. - Improved after starting lantus regimen.  Anemia   Patient was transfused 1 unit PRBC on 9/8. I have consulted Dr Cyndie Chime of heme/onc consulted. Myeloma panel ordered and result showing no presence of monoclonal gammopathy. Patient with history of chronic anemia, had seen hematologist in [facility]. - start patient on oral regimen ferrous sulfate.  Chronic Right LE ulcer: no significant drainage, wound care consulted.   Severe Back Pain  MRI showing extensive degenerative disc disease. Patient would likely benefit from physical therapy. I have ordered a lidocaine patch 12  hours on 12 hours off today to see if this helps.  Disposition Plan to transition patient to skilled nursing facility for rehabilitation. Awaiting for PASSR, referrals have been made.  Code Status: Full Code.   Family Communication: Care discussed with Patient.  Disposition Plan: Social work consult, plan for transition to skilled nursing facility for rehabilitation   Consultants:  Urology  Infectious disease  Hematology  Antibiotics:  Ceftriaxone 9-8 (discontinued)  Vancomycin 9-8 (discontinued)  Cephazolin 1g IV q 8 hours day 6/7   Objective: Filed Vitals:   07/03/13 1309  BP: 160/91  Pulse: 94  Temp: 98.3 F (36.8 C)  Resp: 20    Intake/Output Summary (Last 24 hours) at 07/03/13 1424 Last data filed at 07/03/13 1300  Gross per 24 hour  Intake 1435.33 ml  Output    850 ml  Net 585.33 ml   Filed Weights   07/03/13 0502  Weight: 56.019 kg (123 lb 8 oz)    Exam:   General:  In NAD, Alert and awake  Cardiovascular: S 1, S 2 RRR  Respiratory: CTA BL, no increased WOB  Abdomen: BS present, soft, nt  Musculoskeletal: no cyanosis or clubbing  Skin: Chronic ulceration noted over the shin of right lower extremity.    Data Reviewed: Basic Metabolic Panel:  Recent Labs Lab 06/27/13 0452 06/28/13 0450 06/29/13 0615 07/02/13 0600 07/03/13 0715  NA 132* 134* 132*  --  137  K 5.1 4.9 5.0  --  3.9  CL 103 105 101  --  103  CO2 23 23 22   --  28  GLUCOSE 176* 241* 283*  --  141*  BUN 25* 24* 21  --  13  CREATININE 1.23 1.14 1.11 1.05 1.10  CALCIUM 8.4 8.4 8.8  --  8.9   Liver Function Tests:  Recent Labs Lab 06/28/13 1606  PROT 6.4   No results found for this basename: LIPASE, AMYLASE,  in the last 168 hours No results found for this basename: AMMONIA,  in the last 168 hours CBC:  Recent Labs Lab 06/27/13 0452 06/28/13 0450 06/29/13 0615 07/01/13 0704  WBC 8.2 7.1 6.1 4.6  HGB 7.6* 7.7* 8.6* 8.8*  HCT 24.0* 24.6* 28.3* 28.3*  MCV 82.2 82.8 83.7 83.2  PLT 272 281 281 283   Cardiac Enzymes: No results found for this basename: CKTOTAL, CKMB, CKMBINDEX, TROPONINI,  in the last 168 hours BNP (last 3 results) No results found for this basename:  PROBNP,  in the last 8760 hours CBG:  Recent Labs Lab 07/02/13 1219 07/02/13 1620 07/02/13 2304 07/03/13 0745 07/03/13 1211  GLUCAP 353* 342* 143* 127* 233*    Recent Results (from the past 240 hour(s))  URINE CULTURE     Status: None   Collection Time    06/25/13  5:02 AM      Result Value Range Status   Specimen Description URINE, CLEAN CATCH   Final   Special Requests NONE   Final   Culture  Setup Time     Final   Value: 06/25/2013 06:00     Performed at [facility] Count     Final   Value: >=100,000 COLONIES/ML     Performed at [facility]   Culture     Final   Value: STAPHYLOCOCCUS AUREUS     Note: RIFAMPIN AND GENTAMICIN SHOULD NOT BE USED AS SINGLE DRUGS FOR TREATMENT OF STAPH INFECTIONS.     Performed at [facility]  Report Status 06/27/2013 FINAL   Final   Organism ID, Bacteria STAPHYLOCOCCUS AUREUS   Final  CULTURE, BLOOD (ROUTINE X 2)     Status: None   Collection Time    06/25/13  8:25 AM      Result Value Range Status   Specimen Description BLOOD RIGHT ARM   Final   Special Requests BOTTLES DRAWN AEROBIC AND ANAEROBIC 10CC EACH   Final   Culture  Setup Time     Final   Value: 06/25/2013 16:16     Performed at [facility]   Culture     Final   Value: NO GROWTH 5 DAYS     Performed at [facility]   Report Status 07/01/2013 FINAL   Final  CULTURE, BLOOD (ROUTINE X 2)     Status: None   Collection Time    06/25/13  8:40 AM      Result Value Range Status   Specimen Description BLOOD RIGHT HAND   Final   Special Requests BOTTLES DRAWN AEROBIC AND ANAEROBIC 10CC EACH   Final   Culture  Setup Time     Final   Value: 06/25/2013 16:16     Performed at [facility]   Culture     Final   Value: NO GROWTH 5 DAYS     Performed at [facility]   Report Status 07/01/2013 FINAL   Final  MRSA PCR SCREENING     Status: Abnormal   Collection Time    06/25/13 10:32 AM      Result Value Range  Status   MRSA by PCR POSITIVE (*) NEGATIVE Final   Comment:            The GeneXpert MRSA Assay (FDA     approved for NASAL specimens     only), is one component of a     comprehensive MRSA colonization     surveillance program. It is not     intended to diagnose MRSA     infection nor to guide or     monitor treatment for     MRSA infections.     RESULT CALLED TO, READ BACK BY AND VERIFIED WITH:     AGUIRRI,A RN 06/25/13 1304 WOOTEN,K     Studies: No results found.  Scheduled Meds: .  ceFAZolin (ANCEF) IV  1 g Intravenous Q8H  . chlorthalidone  25 mg Oral Daily  . enoxaparin (LOVENOX) injection  40 mg Subcutaneous Daily  . feeding supplement  237 mL Oral BID BM  . insulin aspart  0-5 Units Subcutaneous QHS  . insulin aspart  0-9 Units Subcutaneous TID WC  . insulin glargine  15 Units Subcutaneous QHS  . lidocaine  1 patch Transdermal Q24H  . lisinopril  5 mg Oral Daily  . polyethylene glycol  17 g Oral BID  . senna-docusate  1 tablet Oral BID  . simvastatin  20 mg Oral Daily  . sodium chloride  3 mL Intravenous Q12H   Continuous Infusions: . sodium chloride 100 mL/hr at 07/03/13 0542    Principal Problem:   Sepsis Active Problems:   UTI (lower urinary tract infection)   Ureteral obstruction, left   Acute renal failure   HTN (hypertension)   DM (diabetes mellitus) type 2, uncontrolled, with ketoacidosis    Time spent: 35 minutes.     Penny Pia  [facility] Pager [phone]. If 7PM-7AM, please contact night-coverage at [email or web address], password [facility] 07/03/2013, 2:24 PM  LOS: 8 days

## 2013-07-03 NOTE — Progress Notes (Signed)
Hemoglobin stable at 8.8 g Serum protein electrophoresis does not show presence of a monoclonal gammopathy. This confirms previous results done just prior to this admission. I do not see that he has been started on oral iron. He did get one dose of parenteral iron on September 2 prior to admission. Please see my consultation note. Although it is unlikely, given his low albumin without any other good explanation, I have recommended a 24-hour urine collection for total protein, creatinine clearance, and immunofixation electrophoresis. I do not see that this was ordered yet either I will put in the orders right now. Of note, he has good outpatient followup with the Gen. internal medicine Dr. Lacie Scotts and with hematology Dr. Orlie Dakin at [facility] and his doctor should receive copies of his current hospital workup at time of discharge.

## 2013-07-03 NOTE — Progress Notes (Signed)
Patient/family have selected [facility] for short term SNF.  CSW contacted admissions Victorino Dike) at facility- they are initiating to authorization for SNF rehab with [employer].  Updated OT, MD and PT note (06/30/13) sent to facility to add to request.  Per Victorino Dike- will hopefully obtain prior authorization from [employer] by tomorrow. Once insurance Berkley Harvey is obtained, can plan d/c to facility when stable per MD.  CSW will continue to follow.  Lorri Frederick. [place]  [phone]

## 2013-07-03 NOTE — Progress Notes (Signed)
Inpatient Diabetes Program Recommendations  AACE/ADA: New Consensus Statement on Inpatient Glycemic Control (2013)  Target Ranges:  Prepandial:   less than 140 mg/dL      Peak postprandial:   less than 180 mg/dL (1-2 hours)      Critically ill patients:  140 - 180 mg/dL   Reason for Visit: Results for POSEIDON, PAM (MRN [id number]) as of 07/03/2013 15:18  Ref. Range 07/03/2013 07:45 07/03/2013 12:11  Glucose-Capillary Latest Range: 70-99 mg/dL 782 (H) 956 (H)   CBG's improved today.  May consider adding Novolog 3 units tid with meals (Hold if patient eats less than 50%).  Beryl Meager, RN, BC-ADM Inpatient Diabetes Coordinator Pager [phone]

## 2013-07-04 LAB — GLUCOSE, CAPILLARY
Glucose-Capillary: 167 mg/dL — ABNORMAL HIGH (ref 70–99)
Glucose-Capillary: 198 mg/dL — ABNORMAL HIGH (ref 70–99)

## 2013-07-04 LAB — CREATININE CLEARANCE, URINE, 24 HOUR
Collection Interval-CRCL: 24 hours
Creatinine Clearance: 34 mL/min — ABNORMAL LOW (ref 75–125)
Urine Total Volume-CRCL: 2400 mL

## 2013-07-04 LAB — PROTEIN, URINE, 24 HOUR
Protein, 24H Urine: 2760 mg/d — ABNORMAL HIGH (ref 50–100)
Protein, Urine: 115 mg/dL

## 2013-07-04 MED ORDER — FERROUS SULFATE 325 (65 FE) MG PO TABS: 325.0000 mg | ORAL_TABLET | Freq: Three times a day (TID) | ORAL | Status: AC

## 2013-07-04 MED ORDER — HYDROCODONE-ACETAMINOPHEN 5-325 MG PO TABS: 1.0000 | ORAL_TABLET | ORAL | Status: DC | PRN

## 2013-07-04 MED ORDER — ONDANSETRON HCL 4 MG PO TABS: 4.0000 mg | ORAL_TABLET | Freq: Four times a day (QID) | ORAL | Status: AC | PRN

## 2013-07-04 MED ORDER — SENNOSIDES-DOCUSATE SODIUM 8.6-50 MG PO TABS: 1.0000 | ORAL_TABLET | Freq: Two times a day (BID) | ORAL | Status: AC | PRN

## 2013-07-04 NOTE — Discharge Summary (Signed)
Physician Discharge Summary  Miguel Leblanc ZOX:[id number] DOB: 10/11/1946 DOA: 06/25/2013  PCP: Pcp Not In System  Admit date: 06/25/2013 Discharge date: 07/04/2013  Time spent: > 35 minutes  Recommendations for Outpatient Follow-up:  Please follow up with your Urologist  in 1-2 weeks. Secretary to help set up follow up appointment Miguel Leblanc of urology is planning cystoscopy with RTG's and prostate [place] with biopsy at [facility], outpatient procedure on 9/23.   Discharge Diagnoses:  Principal Problem:   Sepsis Active Problems:   UTI (lower urinary tract infection)   Ureteral obstruction, left   Acute renal failure   HTN (hypertension)   DM (diabetes mellitus) type 2, uncontrolled, with ketoacidosis   Discharge Condition: Stable  Diet recommendation: Low sodium heart healthy  Filed Weights   07/03/13 0502 07/04/13 0423  Weight: 56.019 kg (123 lb 8 oz) 56.019 kg (123 lb 8 oz)    History of present illness:  Miguel Leblanc is a 67 year old gentleman with a past medical history of recurrent nephrolithiasis, chronic anemia requiring blood transfusions in the past, chronic right lower extremity ulcer, who presented to the emergent apartment on 06/25/2013 with complaints of severe lower back pain. He had reported passing small black stones in his urine over the past 3 weeks prior to this hospitalization. In the emergency department he was found to be hypotensive with systolic blood pressures in the 80s, a CT scan of abdomen and pelvis showed a punctate-sized stone in the distal left ureter with moderate proximal obstruction. There was evidence suggesting cystitis. Patient was evaluated by urology during this hospitalization.   Hospital Course:  Sepsis  Present on admission evidence by hypotension, mild lactic acidosis, tachycardia, with a source of infection suspected to be urinary tract. UA showing pyuria, and cultures positive for Staph. Infectious disease consulted. He was given 2  days of IV ceftriaxone initially, Then completed 7 days of cefazolin - sepsis resolved.  - d/c on bactrim  Obstructive Uropathy/ Pyelonephritis:  Imaging showing left ureter with moderate proximal obstruction, and nodular prostate gland. Miguel Leblanc of urology is planning cystoscopy with RTG's and prostate [place] with biopsy at [facility], outpatient procedure on 9/23.   Acute renal failure  Likely secondary to obstructive uropathy. Kidney function improved and on last check WNL   Diabetes:  - stable and we will continue home regimen.  - Blood sugars had been elevated as patient's po intake improved  Anemia  Patient was transfused 1 unit PRBC on 9/8. I have consulted Miguel Cyndie Leblanc of heme/onc consulted. Myeloma panel ordered and result showing no presence of monoclonal gammopathy. Patient with history of chronic anemia, had seen hematologist in [facility].  - start patient on oral regimen ferrous sulfate.   Chronic Right LE ulcer: - no significant drainage, wound care consulted while patient was in house.   Severe Back Pain  MRI showing extensive degenerative disc disease. Patient would likely benefit from physical therapy.  - Oncology evaluated and did not suspect oncological cause for cause of back pain. Please see their note 07/03/13   Procedures:  None  Consultations:  Urology: Miguel Leblanc  Oncology: Miguel Leblanc  Discharge Exam: Filed Vitals:   07/04/13 1006  BP: 169/89  Pulse: 92  Temp: 98.5 F (36.9 C)  Resp: 20    General: Pt in NAD, Alert and awake Cardiovascular: RRR, no MRG Respiratory: CTA BL, no wheezes  Discharge Instructions  Discharge Orders   Future Orders Complete By Expires  Call MD for:  extreme fatigue  As directed    Call MD for:  persistant nausea and vomiting  As directed    Call MD for:  severe uncontrolled pain  As directed    Diet - low sodium heart healthy  As directed    Discharge instructions  As directed     Comments:     Please take bactrim DS po qhs until f/u with urologist.   Increase activity slowly  As directed        Medication List    STOP taking these medications       ibuprofen 200 MG tablet  Commonly known as:  ADVIL,MOTRIN     PRESCRIPTION MEDICATION      TAKE these medications       acetaminophen 500 MG tablet  Commonly known as:  TYLENOL  Take 1,000 mg by mouth every 4 (four) hours as needed for pain.     chlorthalidone 25 MG tablet  Commonly known as:  HYGROTON  Take 25 mg by mouth daily.     glimepiride 4 MG tablet  Commonly known as:  AMARYL  Take 4 mg by mouth daily before breakfast.     HYDROcodone-acetaminophen 5-325 MG per tablet  Commonly known as:  NORCO/VICODIN  Take 1-2 tablets by mouth every 4 (four) hours as needed.     LANTUS SOLOSTAR 100 UNIT/ML Sopn  Generic drug:  Insulin Glargine  Inject 20-30 Units into the skin at bedtime.     lisinopril 5 MG tablet  Commonly known as:  PRINIVIL,ZESTRIL  Take 5 mg by mouth daily.     NOVOLOG FLEXPEN 100 UNIT/ML Sopn FlexPen  Generic drug:  insulin aspart  Inject 6-10 Units into the skin 3 (three) times daily with meals. Based on sugar levels     ondansetron 4 MG tablet  Commonly known as:  ZOFRAN  Take 1 tablet (4 mg total) by mouth every 6 (six) hours as needed for nausea.     senna-docusate 8.6-50 MG per tablet  Commonly known as:  Senokot-S  Take 1 tablet by mouth 2 (two) times daily as needed for constipation.     simvastatin 20 MG tablet  Commonly known as:  ZOCOR  Take 20 mg by mouth daily.     sulfamethoxazole-trimethoprim 800-160 MG per tablet  Commonly known as:  BACTRIM DS  Take 1 tablet by mouth at bedtime.       No Known Allergies     Follow-up Information   Follow up with Miguel Crete, MD On 07/11/2013. (You are scheduled for a cystoscopy and biopsy at [facility] surgical center that day. )    Specialty:  Urology   Contact information:   [address]  [place] [address] [phone]        The results of significant diagnostics from this hospitalization (including imaging, microbiology, ancillary and laboratory) are listed below for reference.    Significant Diagnostic Studies: Ct Abdomen Pelvis Wo Contrast  06/25/2013   *RADIOLOGY REPORT*  Clinical Data: Left flank pain starting 2 weeks ago.  Known kidney stones.  CT ABDOMEN AND PELVIS WITHOUT CONTRAST  Technique:  Multidetector CT imaging of the abdomen and pelvis was performed following the standard protocol without intravenous contrast.  Comparison: 08/20/2007  Findings: Small focal areas of tree in bud infiltrates in the right lung base may represent alveolitis.  Healing fracture of the right lower anterior rib.  Coronary artery calcifications.  Punctate sized nonobstructing intrarenal  stones bilaterally.  There is left-sided pyelocaliectasis and ureterectasis with left pararenal and periureteral stranding.  There is a punctate sized stone in the distal left ureter just above the ureterovesicle junction.  No right-sided pyelocaliectasis or ureterectasis.  No right ureteral or bladder stones.  The bladder wall is thickened suggesting cystitis.  Left pyelonephritis is not excluded.  The unenhanced appearance of the liver, spleen, gallbladder, pancreas, adrenal glands, abdominal aorta, and inferior vena cava are unremarkable except for vascular calcifications.  The stomach and small bowel are decompressed.  Stool filled colon without distension.  No free air or free fluid in the abdomen.  Pelvis:  Rectosigmoid colon is contrast filled.  No evidence of diverticulitis.  No free or loculated pelvic fluid collections. Mild enlargement of the prostate gland.  Appendix is not identified.  Degenerative changes in the lumbar spine and hips.  No destructive bone lesions appreciated.  IMPRESSION: Punctate sized stone in the distal left ureter with moderate proximal obstruction.  There is diffuse bladder  wall thickening which may represent cystitis.  Periureteral and pararenal stranding on the left is probably due to obstruction but pyelonephritis is not excluded.  Bilateral nonobstructing intrarenal stones.   Original Report Authenticated By: Burman Nieves, M.D.   Mr Lumbar Spine Wo Contrast  06/25/2013   CLINICAL DATA:  Low back pain. Diabetes. Hypertension.  EXAM: MRI LUMBAR SPINE WITHOUT CONTRAST; MR SACRUM WITHOUT CONTRAST  TECHNIQUE: Multiplanar, multisequence MR imaging was performed. No intravenous contrast was administered.  COMPARISON:  CT scan dated 06/25/2013  FINDINGS: MRI LUMBAR SPINE:  Reduced generalized marrow signal in the lumbar spine on T1 weighted images. Low under during recovery weighted diffuse marrow signal observed, although some of this may be attributable to chemical shift artifact given the exaggerated chemical shift artifact on the T2 axial images.  The lowest lumbar type non-rib-bearing vertebra is labeled as L5. The conus medullaris appears normal. Conus level: L1.  A hemangioma is present in the S1 vertebral body.  Paraspinal edema noted. Degenerative endplate findings noted at L3-4 and L5-S1 with loss of disc height particularly at L3-4. Additional findings at individual levels are as follows: L1-2: Unremarkable.  L2-3: No impingement. Disk bulge abuts but does not displace the left L3 nerve in the lateral extraforaminal space.  L3-4: Moderate right and mild left foraminal stenosis with mild bilateral subarticular lateral recess stenosis and moderate central stenosis secondary to posterior osseous ridging and facet arthropathy. Spurring deviates the right L3 nerve in the lateral extraforaminal space.  L4-5: Prominent left foraminal stenosis noted with moderate central stenosis, moderate left and mild right subarticular lateral recess stenosis secondary to broad left paracentral on lateral recess disc protrusion, facet arthropathy, and intervertebral spurring.  L5-S1: Moderate  left foraminal stenosis due to intervertebral spurring. Facet spurring noted on the left.  MRI SACRUM:  Small hemangioma in the S1 vertebra noted. Trace right hip effusion. There is abnormal low grade edema tracking along the psoas and iliacus muscles bilaterally in a symmetric fashion.  Low T1 signal intensity is present in the S3-4 level of the sacrum with a small central focus of higher inversion recovery weighted signal representing a small sacral lesion which is new compared to the 2008 examination is ; this lesion has intermediate T1 signal characteristics.  Subtle edema tracks along the piriformis musculature and along the gluteal tissue planes.  No definite generalized bony sclerosis on the CT scan from 06/25/2013.  Mild bilateral distal hydroureter.  IMPRESSION: LUMBAR SPINE:  1. Lumbar spondylosis  and degenerative disc disease causing prominent impingement at L4-5 and moderate impingement at L3-4 and L5-S1 as detailed above. 2. Reduced T1 and T2 marrow signal. Although this can be countered in setting such as myelofibrosis and mastocytosis, as well as hemosiderosis, we do not demonstrate prominent marrow sclerosis on the CT images. Infiltrative marrow disorder is a differential diagnostic consideration. Red marrow reconversion in light of the patient's anemia is a possibility, but typically the would have high T2 signal rather than low T2 signal. Bone marrow biopsy could be utilized for further characterization of the marrow. SACRUM:  1. Small nonspecific lesion in the S3 vertebral body with adjacent low T1 signal. This lesion has faintly elevated T2 signal. No significant extraosseous component. This may be a small chondroid or vascular lesion but was not visible in 2008. Localized amyloid deposition could have a similar appearance. This may warrant followup imaging to exclude progression/malignancy. 2. Small hemangioma in the S1 vertebral body. 3. Diffuse edema tracking along paraspinal, iliopsoas, and  pelvic muscle groups compatible with 3rd spacing of fluid, potentially related to the patient's hypoalbuminemia. 4. Distal hydroureter bilaterally.   Electronically Signed   By: Herbie Baltimore   On: 06/25/2013 13:22   Mr Sacrum/si Joints Wo Contrast  06/25/2013   CLINICAL DATA:  Low back pain. Diabetes. Hypertension.  EXAM: MRI LUMBAR SPINE WITHOUT CONTRAST; MR SACRUM WITHOUT CONTRAST  TECHNIQUE: Multiplanar, multisequence MR imaging was performed. No intravenous contrast was administered.  COMPARISON:  CT scan dated 06/25/2013  FINDINGS: MRI LUMBAR SPINE:  Reduced generalized marrow signal in the lumbar spine on T1 weighted images. Low under during recovery weighted diffuse marrow signal observed, although some of this may be attributable to chemical shift artifact given the exaggerated chemical shift artifact on the T2 axial images.  The lowest lumbar type non-rib-bearing vertebra is labeled as L5. The conus medullaris appears normal. Conus level: L1.  A hemangioma is present in the S1 vertebral body.  Paraspinal edema noted. Degenerative endplate findings noted at L3-4 and L5-S1 with loss of disc height particularly at L3-4. Additional findings at individual levels are as follows: L1-2: Unremarkable.  L2-3: No impingement. Disk bulge abuts but does not displace the left L3 nerve in the lateral extraforaminal space.  L3-4: Moderate right and mild left foraminal stenosis with mild bilateral subarticular lateral recess stenosis and moderate central stenosis secondary to posterior osseous ridging and facet arthropathy. Spurring deviates the right L3 nerve in the lateral extraforaminal space.  L4-5: Prominent left foraminal stenosis noted with moderate central stenosis, moderate left and mild right subarticular lateral recess stenosis secondary to broad left paracentral on lateral recess disc protrusion, facet arthropathy, and intervertebral spurring.  L5-S1: Moderate left foraminal stenosis due to intervertebral  spurring. Facet spurring noted on the left.  MRI SACRUM:  Small hemangioma in the S1 vertebra noted. Trace right hip effusion. There is abnormal low grade edema tracking along the psoas and iliacus muscles bilaterally in a symmetric fashion.  Low T1 signal intensity is present in the S3-4 level of the sacrum with a small central focus of higher inversion recovery weighted signal representing a small sacral lesion which is new compared to the 2008 examination is ; this lesion has intermediate T1 signal characteristics.  Subtle edema tracks along the piriformis musculature and along the gluteal tissue planes.  No definite generalized bony sclerosis on the CT scan from 06/25/2013.  Mild bilateral distal hydroureter.  IMPRESSION: LUMBAR SPINE:  1. Lumbar spondylosis and degenerative disc disease causing  prominent impingement at L4-5 and moderate impingement at L3-4 and L5-S1 as detailed above. 2. Reduced T1 and T2 marrow signal. Although this can be countered in setting such as myelofibrosis and mastocytosis, as well as hemosiderosis, we do not demonstrate prominent marrow sclerosis on the CT images. Infiltrative marrow disorder is a differential diagnostic consideration. Red marrow reconversion in light of the patient's anemia is a possibility, but typically the would have high T2 signal rather than low T2 signal. Bone marrow biopsy could be utilized for further characterization of the marrow. SACRUM:  1. Small nonspecific lesion in the S3 vertebral body with adjacent low T1 signal. This lesion has faintly elevated T2 signal. No significant extraosseous component. This may be a small chondroid or vascular lesion but was not visible in 2008. Localized amyloid deposition could have a similar appearance. This may warrant followup imaging to exclude progression/malignancy. 2. Small hemangioma in the S1 vertebral body. 3. Diffuse edema tracking along paraspinal, iliopsoas, and pelvic muscle groups compatible with 3rd  spacing of fluid, potentially related to the patient's hypoalbuminemia. 4. Distal hydroureter bilaterally.   Electronically Signed   By: Herbie Baltimore   On: 06/25/2013 13:22   Dg Bone Survey Met  06/29/2013   *RADIOLOGY REPORT*  Clinical Data: Abnormal MRI, evaluate for myeloma  METASTATIC BONE SURVEY  Comparison: MR lumbar spine of 06/25/2013  Findings: The lateral view of the skull shows no radiolucent lesion.  No bony abnormality is seen.  The cervical vertebrae are straightened in alignment.  The bones appear somewhat osteopenic.  There are diffuse degenerative changes noted with anterior osteophytes and loss of disc spaces C4-5. There appears to be fusion at the C5-6 level.  Views of the thoracic spine shows slight thoracic kyphosis and diffuse degenerative change.  No compression deformity is seen.  The lumbar vertebrae are straightened in alignment.  Diffuse degenerative change is noted.  No compression deformity is seen.  A view of the pelvis shows no definite sacral lesion.  There is significant degenerative osteophyte formation on the left at the L5- S1 level which could account for some of the abnormality noted on MRI.  Both hips are in normal position with mild degenerative change.  Views of both femurs show no radiolucent lesion.  Views of the right shoulder and right humerus show degenerative change of the right shoulder most typical of chronic rotator cuff disease as well as degenerative joint disease involving the shoulder.  However no radiolucent lesion is seen.  Views of the left shoulder showed moderate degenerative joint disease of the left shoulder with less involvement of the rotator cuff noted on the right.  There is degenerative change at the left [facility] joint.  No radiolucent lesion is seen.  A frontal chest x-ray shows the lungs be clear.  Mediastinal contours appear normal.  The heart is within normal limits in size. The bones appear osteopenic with degenerative changes throughout the  thoracic spine.  IMPRESSION:  1.  No radiographic evidence of multiple myeloma is seen. 2.  Diffuse degenerative change. 3.  The abnormality noted by MR involving the left sacrum may represent significant osteophyte formation at the L5 S1 level on the left.  No definite sacral lesion is identified by plain film.   Original Report Authenticated By: Dwyane Dee, M.D.    Microbiology: Recent Results (from the past 240 hour(s))  URINE CULTURE     Status: None   Collection Time    06/25/13  5:02 AM  Result Value Range Status   Specimen Description URINE, CLEAN CATCH   Final   Special Requests NONE   Final   Culture  Setup Time     Final   Value: 06/25/2013 06:00     Performed at [facility] Count     Final   Value: >=100,000 COLONIES/ML     Performed at [facility]   Culture     Final   Value: STAPHYLOCOCCUS AUREUS     Note: RIFAMPIN AND GENTAMICIN SHOULD NOT BE USED AS SINGLE DRUGS FOR TREATMENT OF STAPH INFECTIONS.     Performed at [facility]   Report Status 06/27/2013 FINAL   Final   Organism ID, Bacteria STAPHYLOCOCCUS AUREUS   Final  CULTURE, BLOOD (ROUTINE X 2)     Status: None   Collection Time    06/25/13  8:25 AM      Result Value Range Status   Specimen Description BLOOD RIGHT ARM   Final   Special Requests BOTTLES DRAWN AEROBIC AND ANAEROBIC 10CC EACH   Final   Culture  Setup Time     Final   Value: 06/25/2013 16:16     Performed at [facility]   Culture     Final   Value: NO GROWTH 5 DAYS     Performed at [facility]   Report Status 07/01/2013 FINAL   Final  CULTURE, BLOOD (ROUTINE X 2)     Status: None   Collection Time    06/25/13  8:40 AM      Result Value Range Status   Specimen Description BLOOD RIGHT HAND   Final   Special Requests BOTTLES DRAWN AEROBIC AND ANAEROBIC 10CC EACH   Final   Culture  Setup Time     Final   Value: 06/25/2013 16:16     Performed at [facility]   Culture     Final    Value: NO GROWTH 5 DAYS     Performed at [facility]   Report Status 07/01/2013 FINAL   Final  MRSA PCR SCREENING     Status: Abnormal   Collection Time    06/25/13 10:32 AM      Result Value Range Status   MRSA by PCR POSITIVE (*) NEGATIVE Final   Comment:            The GeneXpert MRSA Assay (FDA     approved for NASAL specimens     only), is one component of a     comprehensive MRSA colonization     surveillance program. It is not     intended to diagnose MRSA     infection nor to guide or     monitor treatment for     MRSA infections.     RESULT CALLED TO, READ BACK BY AND VERIFIED WITH:     AGUIRRI,A RN 06/25/13 1304 WOOTEN,K     Labs: Basic Metabolic Panel:  Recent Labs Lab 06/28/13 0450 06/29/13 0615 07/02/13 0600 07/03/13 0715  NA 134* 132*  --  137  K 4.9 5.0  --  3.9  CL 105 101  --  103  CO2 23 22  --  28  GLUCOSE 241* 283*  --  141*  BUN 24* 21  --  13  CREATININE 1.14 1.11 1.05 1.10  CALCIUM 8.4 8.8  --  8.9   Liver Function Tests:  Recent Labs Lab 06/28/13 1606  PROT 6.4  No results found for this basename: LIPASE, AMYLASE,  in the last 168 hours No results found for this basename: AMMONIA,  in the last 168 hours CBC:  Recent Labs Lab 06/28/13 0450 06/29/13 0615 07/01/13 0704  WBC 7.1 6.1 4.6  HGB 7.7* 8.6* 8.8*  HCT 24.6* 28.3* 28.3*  MCV 82.8 83.7 83.2  PLT 281 281 283   Cardiac Enzymes: No results found for this basename: CKTOTAL, CKMB, CKMBINDEX, TROPONINI,  in the last 168 hours BNP: BNP (last 3 results) No results found for this basename: PROBNP,  in the last 8760 hours CBG:  Recent Labs Lab 07/03/13 0745 07/03/13 1211 07/03/13 1709 07/03/13 1801 07/04/13 0801  GLUCAP 127* 233* 414* 371* 142*       Signed:  Penny Pia  [facility] 07/04/2013, 12:13 PM
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
## 2013-07-05 ENCOUNTER — Other Ambulatory Visit: Payer: Self-pay | Admitting: Urology

## 2013-07-07 ENCOUNTER — Encounter (HOSPITAL_BASED_OUTPATIENT_CLINIC_OR_DEPARTMENT_OTHER): Payer: Self-pay | Admitting: *Deleted

## 2013-07-07 NOTE — Progress Notes (Addendum)
AFTER DISCHARGE FROM [facility] 07-04-2013; PT TRANSFERRED TO  [facility] IN [place] , [address][phone].  PAM FOR [facility] OFFICE HAS FAXED ORDERS AND INSTRUCTIONS TO FACILITY.  OBTAINED INFORMATION FROM PAM ABOUT PT AND FAMILY.  CALLED AND SPOKE W/ PT'S NURSE AT FACILITY, THELMA HENDERSON LPN, TRANSPORTATION HAS BEEN SET UP FOR DOS BUT THEY HAD NOT RECEIVED ORDERS/ INSTRUCTIONS YET, I TOLD HER I WILL SPEAK PAM ABOUT THAT. EXPLAINED TO HER WHAT PROCEDURE WAS AND TIME OF ARRIVAL .  I  LEFT MESSAGE FOR WIFE TO CALL BACK , TO GET FURTHER HX INFO AND CONFIRM DOS.  SPOKE W/ PAM AGAIN SHE WILL FAX ORDERS/ INSTRUCTIONS AGAIN TO THELMA'S ATTN TODAY.  I AM TO CALL THELMA BACK ON Monday MORNING  07-10-2013 TO CONFIRM ALL DETAILS FOR DOS.  MEANWHILE I GAVE HER ORAL INSTRUCTIONS FOR PT , NPO AFTER MN. ARRIVE AT 0700. ONE FLEET ENEMA AM DOS.  PT WILL NEED ISTAT 8 ON ARRIVAL AND CURRENT EKG IN EPIC AND CHART.  SPOKE W/ PT WIFE TO FINISH HEALTH HX AND SPOKE ABOUT TRANSPORTATION DOS , PER WIFE POSSIBLE SHE AND HER DAUGHTER , VICKIE, MAY BE TRANSPORTING BECAUSE OF EXPENSE. SHE WILL BE COORDINATING THIS WITH FACILITY.    CALLED AND SPOKE W/ PT NURSE, THELMA HENDERSON LPN, CONFIRMED THAT PT WIFE AND DAUGHTER IS TRANSPORTING PT DOS NOT FACILITY. ORDERS GIVEN FOR NPO AFTER MN. ONE FLEET ENEMA AM DOS.

## 2013-07-07 NOTE — Progress Notes (Signed)
07/07/13 1343  OBSTRUCTIVE SLEEP APNEA  Have you ever been diagnosed with sleep apnea through a sleep study? No  Do you snore loudly (loud enough to be heard through closed doors)?  1  Do you often feel tired, fatigued, or sleepy during the daytime? 1  Has anyone observed you stop breathing during your sleep? 0  Do you have, or are you being treated for high blood pressure? 1  BMI more than 35 kg/m2? 0  Age over 67 years old? 1  Neck circumference greater than 40 cm/18 inches? 0  Gender: 1  Obstructive Sleep Apnea Score 5  Score 4 or greater  Results sent to PCP

## 2013-07-10 NOTE — Anesthesia Preprocedure Evaluation (Addendum)
Anesthesia Evaluation  Patient identified by MRN, date of birth, ID band Patient awake    Reviewed: Allergy & Precautions, H&P , NPO status , [name] Chart, lab work & pertinent test results  Airway Mallampati: II TM Distance: >3 FB Neck ROM: Full    Dental  (+) Dental Advisory Given, Poor Dentition and Chipped   Pulmonary neg pulmonary ROS,  breath sounds clear to auscultation        Cardiovascular hypertension, Pt. on medications and Pt. on home beta blockers + Peripheral Vascular Disease Rhythm:Regular Rate:Normal     Neuro/Psych negative neurological ROS  negative psych ROS   GI/Hepatic negative GI ROS, Neg liver ROS,   Endo/Other  negative endocrine ROSdiabetes, Type 2, Oral Hypoglycemic Agents and Insulin Dependent  Renal/GU ARF and Renal InsufficiencyRenal diseasenegative Renal ROS     Musculoskeletal negative musculoskeletal ROS (+)   Abdominal   Peds  Hematology negative hematology ROS (+)   Anesthesia Other Findings   Reproductive/Obstetrics                         Anesthesia Physical Anesthesia Plan  ASA: III  Anesthesia Plan: General   Post-op Pain Management:    Induction: Intravenous  Airway Management Planned: LMA  Additional Equipment:   Intra-op Plan:   Post-operative Plan:   Informed Consent: I have reviewed the patients History and Physical, chart, labs and discussed the procedure including the risks, benefits and alternatives for the proposed anesthesia with the patient or authorized representative who has indicated his/her understanding and acceptance.   Dental advisory given  Plan Discussed with: CRNA  Anesthesia Plan Comments:         Anesthesia Quick Evaluation

## 2013-07-11 ENCOUNTER — Encounter (HOSPITAL_BASED_OUTPATIENT_CLINIC_OR_DEPARTMENT_OTHER): Payer: Self-pay | Admitting: Anesthesiology

## 2013-07-11 ENCOUNTER — Ambulatory Visit (HOSPITAL_BASED_OUTPATIENT_CLINIC_OR_DEPARTMENT_OTHER): Payer: Managed Care, Other (non HMO) | Admitting: Anesthesiology

## 2013-07-11 ENCOUNTER — Ambulatory Visit (HOSPITAL_COMMUNITY): Payer: Managed Care, Other (non HMO)

## 2013-07-11 ENCOUNTER — Ambulatory Visit (HOSPITAL_BASED_OUTPATIENT_CLINIC_OR_DEPARTMENT_OTHER)
Admission: RE | Admit: 2013-07-11 | Discharge: 2013-07-11 | Disposition: A | Discharge status: Rehabilitation Facility | Payer: Managed Care, Other (non HMO) | Source: Ambulatory Visit | Attending: Urology | Admitting: Urology

## 2013-07-11 ENCOUNTER — Encounter (HOSPITAL_BASED_OUTPATIENT_CLINIC_OR_DEPARTMENT_OTHER): Payer: Self-pay

## 2013-07-11 ENCOUNTER — Encounter (HOSPITAL_BASED_OUTPATIENT_CLINIC_OR_DEPARTMENT_OTHER)
Admission: RE | Disposition: A | Discharge status: Rehabilitation Facility | Payer: Self-pay | Source: Ambulatory Visit | Attending: Urology

## 2013-07-11 DIAGNOSIS — I10 Essential (primary) hypertension: Secondary | ICD-10-CM | POA: Diagnosis not present

## 2013-07-11 DIAGNOSIS — N3080 Other cystitis without hematuria: Secondary | ICD-10-CM | POA: Diagnosis not present

## 2013-07-11 DIAGNOSIS — E119 Type 2 diabetes mellitus without complications: Secondary | ICD-10-CM | POA: Insufficient documentation

## 2013-07-11 DIAGNOSIS — N3289 Other specified disorders of bladder: Secondary | ICD-10-CM | POA: Diagnosis not present

## 2013-07-11 DIAGNOSIS — N135 Crossing vessel and stricture of ureter without hydronephrosis: Secondary | ICD-10-CM | POA: Insufficient documentation

## 2013-07-11 DIAGNOSIS — N1330 Unspecified hydronephrosis: Secondary | ICD-10-CM | POA: Insufficient documentation

## 2013-07-11 DIAGNOSIS — N418 Other inflammatory diseases of prostate: Secondary | ICD-10-CM | POA: Insufficient documentation

## 2013-07-11 DIAGNOSIS — Z87442 Personal history of urinary calculi: Secondary | ICD-10-CM | POA: Diagnosis not present

## 2013-07-11 DIAGNOSIS — Z794 Long term (current) use of insulin: Secondary | ICD-10-CM | POA: Diagnosis not present

## 2013-07-11 DIAGNOSIS — Z79899 Other long term (current) drug therapy: Secondary | ICD-10-CM | POA: Diagnosis not present

## 2013-07-11 DIAGNOSIS — N329 Bladder disorder, unspecified: Secondary | ICD-10-CM | POA: Insufficient documentation

## 2013-07-11 DIAGNOSIS — N4289 Other specified disorders of prostate: Secondary | ICD-10-CM | POA: Diagnosis not present

## 2013-07-11 HISTORY — DX: Peripheral vascular disease, unspecified: I73.9

## 2013-07-11 HISTORY — DX: Type 2 diabetes mellitus without complications: Z79.4

## 2013-07-11 HISTORY — DX: Calculus of kidney: N20.0

## 2013-07-11 HISTORY — DX: Personal history of urinary calculi: Z87.442

## 2013-07-11 HISTORY — DX: Weakness: R53.1

## 2013-07-11 HISTORY — DX: Type 2 diabetes mellitus without complications: E11.9

## 2013-07-11 HISTORY — DX: Iron deficiency anemia, unspecified: D50.9

## 2013-07-11 HISTORY — PX: PROSTATE BIOPSY: SHX241

## 2013-07-11 HISTORY — PX: CYSTOSCOPY W/ URETERAL STENT PLACEMENT: SHX1429

## 2013-07-11 HISTORY — DX: Thrombocytosis, unspecified: D75.839

## 2013-07-11 HISTORY — DX: Essential (hemorrhagic) thrombocythemia: D47.3

## 2013-07-11 HISTORY — DX: Nodular prostate without lower urinary tract symptoms: N40.2

## 2013-07-11 HISTORY — DX: Personal history of other diseases of urinary system: Z87.448

## 2013-07-11 HISTORY — DX: Unspecified hydronephrosis: N13.30

## 2013-07-11 HISTORY — DX: Calculus of ureter: N20.1

## 2013-07-11 HISTORY — DX: Non-pressure chronic ulcer of unspecified part of right lower leg with unspecified severity: L97.919

## 2013-07-11 HISTORY — DX: Spinal stenosis, lumbar region without neurogenic claudication: M48.061

## 2013-07-11 HISTORY — DX: Disorder of kidney and ureter, unspecified: N28.9

## 2013-07-11 HISTORY — DX: Reserved for concepts with insufficient information to code with codable children: IMO0002

## 2013-07-11 HISTORY — DX: Personal history of other infectious and parasitic diseases: Z86.19

## 2013-07-11 LAB — POCT I-STAT 4, (NA,K, GLUC, HGB,HCT)
Glucose, Bld: 52 mg/dL — ABNORMAL LOW (ref 70–99)
HCT: 31 % — ABNORMAL LOW (ref 39.0–52.0)
Hemoglobin: 10.5 g/dL — ABNORMAL LOW (ref 13.0–17.0)
Potassium: 4.3 mEq/L (ref 3.5–5.1)
Sodium: 135 mEq/L (ref 135–145)

## 2013-07-11 LAB — POCT I-STAT, CHEM 8
BUN: 29 mg/dL — ABNORMAL HIGH (ref 6–23)
Calcium, Ion: 1.36 mmol/L — ABNORMAL HIGH (ref 1.13–1.30)
Creatinine, Ser: 1.8 mg/dL — ABNORMAL HIGH (ref 0.50–1.35)
Glucose, Bld: 167 mg/dL — ABNORMAL HIGH (ref 70–99)
HCT: 34 % — ABNORMAL LOW (ref 39.0–52.0)
Potassium: 3.9 mEq/L (ref 3.5–5.1)
Sodium: 135 mEq/L (ref 135–145)

## 2013-07-11 LAB — GLUCOSE, CAPILLARY
Glucose-Capillary: 43 mg/dL — CL (ref 70–99)
Glucose-Capillary: 116 mg/dL — ABNORMAL HIGH (ref 70–99)

## 2013-07-11 SURGERY — BIOPSY, PROSTATE
Anesthesia: General | Site: Prostate | Wound class: Clean Contaminated

## 2013-07-11 MED ORDER — GENTAMICIN SULFATE 40 MG/ML IJ SOLN
5.0000 mg/kg | INTRAMUSCULAR | Status: AC
  Administered 2013-07-11: 230 mg via INTRAVENOUS
  Filled 2013-07-11: qty 5.75

## 2013-07-11 MED ORDER — ONDANSETRON HCL 4 MG/2ML IJ SOLN
4.0000 mg | Freq: Four times a day (QID) | INTRAMUSCULAR | Status: DC | PRN
  Filled 2013-07-11: qty 2

## 2013-07-11 MED ORDER — GENTAMICIN SULFATE 40 MG/ML IJ SOLN
5.0000 mg/kg | INTRAVENOUS | Status: DC
  Filled 2013-07-11: qty 7

## 2013-07-11 MED ORDER — ACETAMINOPHEN 650 MG RE SUPP
650.0000 mg | RECTAL | Status: DC | PRN
  Filled 2013-07-11: qty 1

## 2013-07-11 MED ORDER — SODIUM CHLORIDE 0.9 % IJ SOLN
3.0000 mL | INTRAMUSCULAR | Status: DC | PRN
  Filled 2013-07-11: qty 3

## 2013-07-11 MED ORDER — STERILE WATER FOR IRRIGATION IR SOLN
Status: DC | PRN
  Administered 2013-07-11: 3000 mL

## 2013-07-11 MED ORDER — ACETAMINOPHEN 325 MG PO TABS
650.0000 mg | ORAL_TABLET | ORAL | Status: DC | PRN
  Filled 2013-07-11: qty 2

## 2013-07-11 MED ORDER — LIDOCAINE HCL 2 % EX GEL
CUTANEOUS | Status: DC | PRN
  Administered 2013-07-11: 1 via URETHRAL

## 2013-07-11 MED ORDER — LIDOCAINE HCL (CARDIAC) 20 MG/ML IV SOLN
INTRAVENOUS | Status: DC | PRN
  Administered 2013-07-11: 50 mg via INTRAVENOUS

## 2013-07-11 MED ORDER — MEPERIDINE HCL 25 MG/ML IJ SOLN
6.2500 mg | INTRAMUSCULAR | Status: DC | PRN
  Filled 2013-07-11: qty 1

## 2013-07-11 MED ORDER — SODIUM CHLORIDE 0.9 % IV SOLN
250.0000 mL | INTRAVENOUS | Status: DC | PRN
  Filled 2013-07-11: qty 250

## 2013-07-11 MED ORDER — OXYCODONE HCL 5 MG PO TABS
5.0000 mg | ORAL_TABLET | ORAL | Status: DC | PRN
  Filled 2013-07-11: qty 2

## 2013-07-11 MED ORDER — IOHEXOL 350 MG/ML SOLN
INTRAVENOUS | Status: DC | PRN
  Administered 2013-07-11: 20 mL

## 2013-07-11 MED ORDER — PROPOFOL 10 MG/ML IV BOLUS
INTRAVENOUS | Status: DC | PRN
  Administered 2013-07-11: 20 mg via INTRAVENOUS
  Administered 2013-07-11: 80 mg via INTRAVENOUS

## 2013-07-11 MED ORDER — FENTANYL CITRATE 0.05 MG/ML IJ SOLN
25.0000 ug | INTRAMUSCULAR | Status: DC | PRN
  Filled 2013-07-11: qty 1

## 2013-07-11 MED ORDER — ONDANSETRON HCL 4 MG/2ML IJ SOLN
INTRAMUSCULAR | Status: DC | PRN
  Administered 2013-07-11: 4 mg via INTRAVENOUS

## 2013-07-11 MED ORDER — PHENAZOPYRIDINE HCL 200 MG PO TABS: 200.0000 mg | ORAL_TABLET | Freq: Three times a day (TID) | ORAL | Status: AC | PRN

## 2013-07-11 MED ORDER — OXYCODONE HCL 5 MG/5ML PO SOLN
5.0000 mg | Freq: Once | ORAL | Status: AC | PRN
  Filled 2013-07-11: qty 5

## 2013-07-11 MED ORDER — OXYCODONE HCL 5 MG PO TABS
5.0000 mg | ORAL_TABLET | Freq: Once | ORAL | Status: AC | PRN
  Administered 2013-07-11: 5 mg via ORAL
  Filled 2013-07-11: qty 1

## 2013-07-11 MED ORDER — HYDROCODONE-ACETAMINOPHEN 5-325 MG PO TABS: 1.0000 | ORAL_TABLET | ORAL | Status: AC | PRN

## 2013-07-11 MED ORDER — FENTANYL CITRATE 0.05 MG/ML IJ SOLN
INTRAMUSCULAR | Status: DC | PRN
  Administered 2013-07-11 (×2): 50 ug via INTRAVENOUS

## 2013-07-11 MED ORDER — DEXTROSE 5 % IV SOLN
1.0000 g | Freq: Once | INTRAVENOUS | Status: AC
  Administered 2013-07-11: 1 g via INTRAVENOUS
  Filled 2013-07-11: qty 10

## 2013-07-11 MED ORDER — PROMETHAZINE HCL 25 MG/ML IJ SOLN
6.2500 mg | INTRAMUSCULAR | Status: DC | PRN
  Filled 2013-07-11: qty 1

## 2013-07-11 MED ORDER — LACTATED RINGERS IV SOLN
INTRAVENOUS | Status: DC
  Administered 2013-07-11 (×2): via INTRAVENOUS
  Filled 2013-07-11: qty 1000

## 2013-07-11 MED ORDER — SODIUM CHLORIDE 0.9 % IJ SOLN
3.0000 mL | Freq: Two times a day (BID) | INTRAMUSCULAR | Status: DC
  Filled 2013-07-11: qty 3

## 2013-07-11 MED ORDER — HYDROMORPHONE HCL PF 1 MG/ML IJ SOLN
0.2500 mg | INTRAMUSCULAR | Status: DC | PRN
  Filled 2013-07-11: qty 1

## 2013-07-11 SURGICAL SUPPLY — 27 items
BAG DRAIN URO-CYSTO SKYTR STRL (DRAIN) ×3 IMPLANT
CANISTER SUCT LVC 12 LTR MEDI- (MISCELLANEOUS) IMPLANT
CATH URET 5FR 28IN CONE TIP (BALLOONS)
CATH URET 5FR 28IN OPEN ENDED (CATHETERS) ×3 IMPLANT
CATH URET 5FR 70CM CONE TIP (BALLOONS) IMPLANT
CLOTH BEACON ORANGE TIMEOUT ST (SAFETY) ×3 IMPLANT
DRAPE CAMERA CLOSED 9X96 (DRAPES) ×3 IMPLANT
DRESSING TELFA 8X3 (GAUZE/BANDAGES/DRESSINGS) ×6 IMPLANT
ELECT REM PT RETURN 9FT ADLT (ELECTROSURGICAL) ×3
ELECTRODE REM PT RTRN 9FT ADLT (ELECTROSURGICAL) ×2 IMPLANT
GLOVE BIOGEL PI IND STRL 7.5 (GLOVE) ×4 IMPLANT
GLOVE BIOGEL PI INDICATOR 7.5 (GLOVE) ×2
GLOVE SURG SS PI 8.0 STRL IVOR (GLOVE) ×6 IMPLANT
GOWN PREVENTION PLUS LG XLONG (DISPOSABLE) ×3 IMPLANT
GOWN STRL NON-REIN LRG LVL3 (GOWN DISPOSABLE) ×3 IMPLANT
GOWN STRL REIN XL XLG (GOWN DISPOSABLE) IMPLANT
GUIDEWIRE 0.038 PTFE COATED (WIRE) IMPLANT
GUIDEWIRE ANG ZIPWIRE 038X150 (WIRE) IMPLANT
GUIDEWIRE STR DUAL SENSOR (WIRE) ×3 IMPLANT
NEEDLE HYPO 22GX1.5 SAFETY (NEEDLE) ×3 IMPLANT
NS IRRIG 500ML POUR BTL (IV SOLUTION) IMPLANT
PACK CYSTOSCOPY (CUSTOM PROCEDURE TRAY) ×3 IMPLANT
STENT URET 6FRX24 CONTOUR (STENTS) ×6 IMPLANT
SURGILUBE 2OZ TUBE FLIPTOP (MISCELLANEOUS) ×3 IMPLANT
TOWEL OR 17X24 6PK STRL BLUE (TOWEL DISPOSABLE) IMPLANT
UNDERPAD 30X30 INCONTINENT (UNDERPADS AND DIAPERS) IMPLANT
WATER STERILE IRR 3000ML UROMA (IV SOLUTION) ×3 IMPLANT

## 2013-07-11 NOTE — Progress Notes (Signed)
Spoke with Selena Batten at [employer] on post op care at [facility]- copy of discharge paper work will be with patient/ family along with prescriptions.Family is driving patient back to facility.

## 2013-07-11 NOTE — Interval H&P Note (Signed)
History and Physical Interval Note: His Hgb is now over 11.   He continues to have back pain.  His Cr is up to 1.8 from 1.1 today.  07/11/2013 8:44 AM  Miguel Leblanc  has presented today for surgery, with the diagnosis of PROSTATE NODULE AND HYDRONEPHROSIS  The various methods of treatment have been discussed with the patient and family. After consideration of risks, benefits and other options for treatment, the patient has consented to  Procedure(s): PROSTATE ULTRASOUND AND BIOPSY (N/A) CYSTOSCOPY WITH BILATERAL RETROGRADE PYELOGRAM, POSSIBLE STENT PLACEMENT (Bilateral) as a surgical intervention .  The patient's history has been reviewed, patient examined, no change in status, stable for surgery.  I have reviewed the patient's chart and labs.  Questions were answered to the patient's satisfaction.     Deaveon Schoen J

## 2013-07-11 NOTE — Anesthesia Postprocedure Evaluation (Signed)
Anesthesia Post Note  Patient: Miguel Leblanc  Procedure(s) Performed: Procedure(s) (LRB): PROSTATE ULTRASOUND AND BIOPSY (N/A) CYSTOSCOPY WITH BILATERAL RETROGRADE PYELOGRAM, POSSIBLE STENT PLACEMENT (Bilateral)  Anesthesia type: General  Patient location: PACU  Post pain: Pain level controlled  Post assessment: Post-op Vital signs reviewed  Last Vitals: BP 143/82  Pulse 78  Temp(Src) 36.4 C (Oral)  Resp 22  Ht 5\' 4"  (1.626 m)  Wt 103 lb (46.72 kg)  BMI 17.67 kg/m2  SpO2 100%  Post vital signs: Reviewed  Level of consciousness: sedated  Complications: No apparent anesthesia complications

## 2013-07-11 NOTE — Op Note (Deleted)
Miguel Leblanc, Miguel Leblanc NO.:  [id number]  MEDICAL RECORD NO.:  [id number]  LOCATION:  5W25C                        FACILITY:  [facility]  PHYSICIAN:  Excell Seltzer. Annabell Howells, M.D.    DATE OF BIRTH:  01-06-1946  DATE OF PROCEDURE:  07/11/2013 DATE OF DISCHARGE:  07/11/2013                              OPERATIVE REPORT   PROCEDURES: 1. Prostate ultrasound and ultrasound-guided biopsy. 2. Cystoscopy with bilateral retrograde pyelography and placement of     bilateral double-J stents. 3. Cystoscopy with biopsy and fulguration of 2 sites.  PREOPERATIVE DIAGNOSES:  Hydronephrosis with a prostate nodule.  POSTOPERATIVE DIAGNOSES:  Hydronephrosis with a prostate nodule with extrinsic obstruction of the ureters and multiple bladder lesions.  SURGEON:  Excell Seltzer. Annabell Howells, M.D.  ANESTHESIA:  General.  SPECIMEN:  Prostate biopsy cores as described below and bladder biopsies.  DRAINS:  Bilateral 6-French x 24 cm contour double-J stent.  BLOOD LOSS:  Minimal.  COMPLICATIONS:  None.  INDICATIONS:  Miguel Leblanc is a 67 year old, white male, who was recently admitted to the hospital with low back pain.  A CT revealed left hydronephrosis.  Initially, it was attributed to a punctate stone, but on my review, I felt that he had a mass effect in the area of the left seminal vesicle, was possible extrinsic compression.  Additionally, he was found to have a prostate nodule at the right apex.  His PSA was very low, but this was worrisome for a high-grade prostate cancer.  FINDINGS OF PROCEDURE:  He was given Rocephin and gentamicin and taken to the operating room where general anesthetic was induced.  He was placed in lithotomy position and fitted with PAS hose.  Time-out was performed.  The transrectal ultrasound probe was assembled and certain the scanning is performed with 10 MHz.  This demonstrated a mass in the area of the left seminal vesicle extending into the base of the prostate and  also causing some thickening of the base of the bladder on the left more than the right.  There also appeared to be some irregularity of the bladder wall, which was worrisome for intravesical neoplasm.  The prostate itself was small with a volume of 19.56 mL, a length of 3.97, the height of 2, and a width of 4.71 cm.  There was some abnormality at the left base.  I did not see an obvious abnormality at the right apex where the nodule that I palpated on the right seminal vesicle was unremarkable.  Once the diagnostic scan had been performed, biopsies were obtained.  I obtained 2 biopsies from the left seminal vesicle mass and then a single cores from the right base, right mid, and right apex.  Additionally, 2 cores were obtained from the left base followed by single cores from the left mid and left apical area due to the small size of the prostate, and an obvious mass.  I did not feel more extensive biopsies were indicated because the small prostate was concerned about needle passage into the bladder and possible bleeding.  Once the biopsy had been completed, the patient was prepped with Betadine solution and draped in usual sterile fashion and time-out was  performed.  The 22-French cystoscope was assembled and inserted.  An attempt was made to insert the scope, but the urethral meatus was too tight.  It was then dilated to 26-French with Sissy Hoff sounds.  The scope was then passed easily.  There was no evidence of other urethral stricturing.  The external sphincter was intact.  The prostatic urethra was short with no lateral lobe enlargement and a very widely patent prostatic fossa, but had some elevation of the bladder neck.  Inspection of the bladder revealed moderate trabeculation with some cellules.  Ureteral orifices were in their normal anatomic position. However, diffusely on the bladder wall were some yellowish red lesions that did not have a papillary appearance nor more  consistent with heaped up mucosa; however, they were concerning for neoplasm versus some sort of amyloid type deposit.  Once thorough cystoscopy had been performed, a 5-French open-end catheter was used to perform retrograde pyelography.  The open-end catheter was placed in the left ureteral orifice and contrast was instilled.  Retrograde pyelogram demonstrated some extrinsic compression of the distal 1-2 cm of the left ureter with proximal dilation with mild tortuosity.  No filling defects were noted proximal to the extrinsic compression.  A right retrograde pyelogram was then performed in a similar fashion and once again there was apparent extrinsic compression of the distal ureter for approximately 2 cm with proximal dilation of hydronephrosis without filling defects.  Once the retrograde pyelogram had been performed, a guidewire was passed to the left kidney and a 6-French 24-cm double-J stent was passed without difficulty to the kidney.  This was then repeated on the right side.  At this point, a cup biopsy forcep was used to biopsy 2 of the representative lesions in the bladder wall, first lateral to the right trigone, the second was on the posterior wall.  The biopsy sites were then fulgurated along with a couple of other more prominent lesions, but it was not felt that I could completely obliterate the lesions and felt that it would be worthwhile to await the pathology before further consideration of treatment at this point.  Upon removal of the scope, there was a little bit of bleeding at the bladder neck, so light fulguration was performed there.  The bladder was then drained and the cystoscope was removed.  The urethra was instilled with 10 mL of 2% lidocaine jelly for postprocedure pain relief.  The gauze which had been placed at the anus was removed.  There did not appear to be active rectal bleeding.  The patient was taken down from lithotomy position. His  anesthetic was reversed.  He was moved to recovery room in stable condition.  There were no complications.     Excell Seltzer. Annabell Howells, M.D.     JJW/MEDQ  D:  07/11/2013  T:  07/11/2013  Job:  [id number]

## 2013-07-11 NOTE — Anesthesia Procedure Notes (Signed)
Procedure Name: LMA Insertion Date/Time: 07/11/2013 8:54 AM Performed by: Maris Berger T Pre-anesthesia Checklist: Patient identified, Emergency Drugs available, Suction available and Patient being monitored Patient Re-evaluated:Patient Re-evaluated prior to inductionOxygen Delivery Method: Circle System Utilized Preoxygenation: Pre-oxygenation with 100% oxygen Intubation Type: IV induction Ventilation: Mask ventilation without difficulty LMA: LMA inserted LMA Size: 4.0 Number of attempts: 1 Airway Equipment and Method: bite block Placement Confirmation: positive ETCO2 Dental Injury: Teeth and Oropharynx as per pre-operative assessment

## 2013-07-11 NOTE — Transfer of Care (Signed)
Immediate Anesthesia Transfer of Care Note  Patient: Miguel Leblanc  Procedure(s) Performed: Procedure(s): PROSTATE ULTRASOUND AND BIOPSY (N/A) CYSTOSCOPY WITH BILATERAL RETROGRADE PYELOGRAM, POSSIBLE STENT PLACEMENT (Bilateral)  Patient Location: PACU  Anesthesia Type:General  Level of Consciousness: sedated  Airway & Oxygen Therapy: Patient Spontanous Breathing and Patient connected to nasal cannula oxygen  Post-op Assessment: Report given to PACU RN  Post vital signs: Reviewed and stable  Complications: No apparent anesthesia complications

## 2013-07-11 NOTE — Op Note (Signed)
NAME:  Miguel Leblanc, Miguel Leblanc                 ACCOUNT NO.:  [id number]  MEDICAL RECORD NO.:  [id number]  LOCATION:  5W25C                        FACILITY:  [facility]  PHYSICIAN:  Mycah Formica J. Brison Fiumara, M.D.    DATE OF BIRTH:  08/19/1946  DATE OF PROCEDURE:  07/11/2013 DATE OF DISCHARGE:  07/11/2013                              OPERATIVE REPORT   PROCEDURES: 1. Prostate ultrasound and ultrasound-guided biopsy. 2. Cystoscopy with bilateral retrograde pyelography and placement of     bilateral double-J stents. 3. Cystoscopy with biopsy and fulguration of 2 sites.  PREOPERATIVE DIAGNOSES:  Hydronephrosis with a prostate nodule.  POSTOPERATIVE DIAGNOSES:  Hydronephrosis with a prostate nodule with extrinsic obstruction of the ureters and multiple bladder lesions.  SURGEON:  Torren Maffeo J. Mallorie Norrod, M.D.  ANESTHESIA:  General.  SPECIMEN:  Prostate biopsy cores as described below and bladder biopsies.  DRAINS:  Bilateral 6-French x 24 cm contour double-J stent.  BLOOD LOSS:  Minimal.  COMPLICATIONS:  None.  INDICATIONS:  Miguel Leblanc is a 67-year-old, white male, who was recently admitted to the hospital with low back pain.  A CT revealed left hydronephrosis.  Initially, it was attributed to a punctate stone, but on my review, I felt that he had a mass effect in the area of the left seminal vesicle, was possible extrinsic compression.  Additionally, he was found to have a prostate nodule at the right apex.  His PSA was very low, but this was worrisome for a high-grade prostate cancer.  FINDINGS OF PROCEDURE:  He was given Rocephin and gentamicin and taken to the operating room where general anesthetic was induced.  He was placed in lithotomy position and fitted with PAS hose.  Time-out was performed.  The transrectal ultrasound probe was assembled and certain the scanning is performed with 10 MHz.  This demonstrated a mass in the area of the left seminal vesicle extending into the base of the prostate and  also causing some thickening of the base of the bladder on the left more than the right.  There also appeared to be some irregularity of the bladder wall, which was worrisome for intravesical neoplasm.  The prostate itself was small with a volume of 19.56 mL, a length of 3.97, the height of 2, and a width of 4.71 cm.  There was some abnormality at the left base.  I did not see an obvious abnormality at the right apex where the nodule that I palpated on the right seminal vesicle was unremarkable.  Once the diagnostic scan had been performed, biopsies were obtained.  I obtained 2 biopsies from the left seminal vesicle mass and then a single cores from the right base, right mid, and right apex.  Additionally, 2 cores were obtained from the left base followed by single cores from the left mid and left apical area due to the small size of the prostate, and an obvious mass.  I did not feel more extensive biopsies were indicated because the small prostate was concerned about needle passage into the bladder and possible bleeding.  Once the biopsy had been completed, the patient was prepped with Betadine solution and draped in usual sterile fashion and time-out was   performed.  The 22-French cystoscope was assembled and inserted.  An attempt was made to insert the scope, but the urethral meatus was too tight.  It was then dilated to 26-French with van Buren sounds.  The scope was then passed easily.  There was no evidence of other urethral stricturing.  The external sphincter was intact.  The prostatic urethra was short with no lateral lobe enlargement and a very widely patent prostatic fossa, but had some elevation of the bladder neck.  Inspection of the bladder revealed moderate trabeculation with some cellules.  Ureteral orifices were in their normal anatomic position. However, diffusely on the bladder wall were some yellowish red lesions that did not have a papillary appearance nor more  consistent with heaped up mucosa; however, they were concerning for neoplasm versus some sort of amyloid type deposit.  Once thorough cystoscopy had been performed, a 5-French open-end catheter was used to perform retrograde pyelography.  The open-end catheter was placed in the left ureteral orifice and contrast was instilled.  Retrograde pyelogram demonstrated some extrinsic compression of the distal 1-2 cm of the left ureter with proximal dilation with mild tortuosity.  No filling defects were noted proximal to the extrinsic compression.  A right retrograde pyelogram was then performed in a similar fashion and once again there was apparent extrinsic compression of the distal ureter for approximately 2 cm with proximal dilation of hydronephrosis without filling defects.  Once the retrograde pyelogram had been performed, a guidewire was passed to the left kidney and a 6-French 24-cm double-J stent was passed without difficulty to the kidney.  This was then repeated on the right side.  At this point, a cup biopsy forcep was used to biopsy 2 of the representative lesions in the bladder wall, first lateral to the right trigone, the second was on the posterior wall.  The biopsy sites were then fulgurated along with a couple of other more prominent lesions, but it was not felt that I could completely obliterate the lesions and felt that it would be worthwhile to await the pathology before further consideration of treatment at this point.  Upon removal of the scope, there was a little bit of bleeding at the bladder neck, so light fulguration was performed there.  The bladder was then drained and the cystoscope was removed.  The urethra was instilled with 10 mL of 2% lidocaine jelly for postprocedure pain relief.  The gauze which had been placed at the anus was removed.  There did not appear to be active rectal bleeding.  The patient was taken down from lithotomy position. His  anesthetic was reversed.  He was moved to recovery room in stable condition.  There were no complications.     Quinesha Selinger J. Kaleen Rochette, M.D.     JJW/MEDQ  D:  07/11/2013  T:  07/11/2013  Job:  [id number] 

## 2013-07-11 NOTE — H&P (View-Only) (Signed)
   Subjective: I was asked to see [name] in consultation by Dr. Regalado for a possible stone.   He had the onset 3 weeks ago of urgency with UUI.   He then passed some small black specks.  He counted about 14.  He thought he was doing better.  Yesterday morning he woke and couldn't get out of bed or walk to the bathroom because of low back pain.   He still has pain now.  He took some tylenol which helped.  He laid about on the couch all day and the pain increased and he was taken to the ER.  He wasn't having fever.  He had no nausea.   He had no hematuria.   He didn't have left flank pain and the pain was below his belt line and severe.  He has no dysuria.   His UA today looks infected and he had a CT that suggests a punctate left distal stone with some hydro, but on a CT in 2008 he had mild ureterectesis without a stone.  He does have bilateral renal stones and he has some bladder wall thickening as well.  On admission he was hypotensive with a low grade fever, anemia and ARI with a Ct of 1.64.  He had a similar episode in 2008 and was to be seen in our office but never came.   He has been seen at [facility] for anemia a couple of times in the last month and was told he didn't have cancer and gave him a transfusion and iron.  ROS: Negative except as above and the complaint of numbness in his feet on a 12 point review.  Past Medical History  Diagnosis Date  . Diabetes mellitus without complication   . Hypertension   . 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  No Known Allergies History   Social History  . Marital Status: Married    Spouse Name: N/A    Number of Children: N/A  . Years of Education: N/A   Occupational History  . Not on file.   Social History Main Topics  . Smoking status: Never Smoker   . Smokeless tobacco: Not on file  . Alcohol Use: No  . Drug Use: No  . Sexual Activity: Not on file   Other Topics Concern  . Not on file   Social History  Narrative  . No narrative on file   History reviewed. No pertinent family history.    Objective: Vital signs in last 24 hours: Temp:  [99.9 F (37.7 C)] 99.9 F (37.7 C) (09/07 0238) Pulse Rate:  [77-103] 77 (09/07 0700) Resp:  [11-20] 11 (09/07 0700) BP: (87-134)/(57-79) 134/77 mmHg (09/07 0700) SpO2:  [85 %-100 %] 94 % (09/07 0712) Weight:  [49.896 kg (110 lb)] 49.896 kg (110 lb) (09/07 0712)  Intake/Output from previous day: 09/06 0701 - 09/07 0700 In: 1500 [I.V.:1500] Out: 150 [Urine:150] Intake/Output this shift: Total I/O In: 999 [I.V.:999] Out: 550 [Urine:550]  General appearance: alert, mild distress and complaining of pain Head: Normocephalic, without obvious abnormality, atraumatic Neck: no adenopathy, no carotid bruit, no JVD, supple, symmetrical, trachea midline and thyroid not enlarged, symmetric, no tenderness/mass/nodules Back: symmetric, no curvature. ROM normal. No CVA tenderness., but he has tenderness over the left sacroiliac area Resp: clear to auscultation bilaterally Cardio: regular rate and rhythm GI: soft, non-tender; bowel sounds normal; no masses,  no organomegaly Male genitalia: penis: no   lesions or discharge. testes: no masses or tenderness. no hernias, He has reduced anal tone.   The prostate is not well defined but it hard with a 1cm central nodule Extremities: extremities normal, atraumatic, no cyanosis or edema and he is missing the right two lateral toes and he has pain with straightly leg raising to about 30 degrees bilaterally Skin: Skin color, texture, turgor normal. No rashes or lesions Lymph nodes: No Supraclavicular or inguinal nodes noted Neurologic: Mental status: Alert, oriented, thought content appropriate Motor: grossly normal Psych: No anxiety or depression.  Lab Results:   Recent Labs  06/25/13 0437  WBC 10.5  HGB 7.0*  HCT 22.4*  PLT 305   BMET  Recent Labs  06/25/13 0437  NA 133*  K 4.3  CL 98  CO2 24   GLUCOSE 127*  BUN 39*  CREATININE 1.64*  CALCIUM 8.7   PT/INR No results found for this basename: LABPROT, INR,  in the last 72 hours ABG No results found for this basename: PHART, PCO2, PO2, HCO3,  in the last 72 hours  Studies/Results: Ct Abdomen Pelvis Wo Contrast  06/25/2013   *RADIOLOGY REPORT*  Clinical Data: Left flank pain starting 2 weeks ago.  Known kidney stones.  CT ABDOMEN AND PELVIS WITHOUT CONTRAST  Technique:  Multidetector CT imaging of the abdomen and pelvis was performed following the standard protocol without intravenous contrast.  Comparison: 08/20/2007  Findings: Small focal areas of tree in bud infiltrates in the right lung base may represent alveolitis.  Healing fracture of the right lower anterior rib.  Coronary artery calcifications.  Punctate sized nonobstructing intrarenal stones bilaterally.  There is left-sided pyelocaliectasis and ureterectasis with left pararenal and periureteral stranding.  There is a punctate sized stone in the distal left ureter just above the ureterovesicle junction.  No right-sided pyelocaliectasis or ureterectasis.  No right ureteral or bladder stones.  The bladder wall is thickened suggesting cystitis.  Left pyelonephritis is not excluded.  The unenhanced appearance of the liver, spleen, gallbladder, pancreas, adrenal glands, abdominal aorta, and inferior vena cava are unremarkable except for vascular calcifications.  The stomach and small bowel are decompressed.  Stool filled colon without distension.  No free air or free fluid in the abdomen.  Pelvis:  Rectosigmoid colon is contrast filled.  No evidence of diverticulitis.  No free or loculated pelvic fluid collections. Mild enlargement of the prostate gland.  Appendix is not identified.  Degenerative changes in the lumbar spine and hips.  No destructive bone lesions appreciated.  IMPRESSION: Punctate sized stone in the distal left ureter with moderate proximal obstruction.  There is diffuse  bladder wall thickening which may represent cystitis.  Periureteral and pararenal stranding on the left is probably due to obstruction but pyelonephritis is not excluded.  Bilateral nonobstructing intrarenal stones.   Original Report Authenticated By: William Stevens, M.D.    Anti-infectives: Anti-infectives   Start     Dose/Rate Route Frequency Ordered Stop   06/26/13 1000  vancomycin (VANCOCIN) IVPB 750 mg/150 ml premix  Status:  Discontinued     750 mg 150 mL/hr over 60 Minutes Intravenous Every 24 hours 06/25/13 0829 06/25/13 0830   06/26/13 0600  cefTRIAXone (ROCEPHIN) 1 g in dextrose 5 % 50 mL IVPB     1 g 100 mL/hr over 30 Minutes Intravenous Every 24 hours 06/25/13 0817     06/25/13 0900  vancomycin (VANCOCIN) IVPB 750 mg/150 ml premix     750 mg 150 mL/hr over 60 Minutes   Intravenous Every 24 hours 06/25/13 0830     06/25/13 0830  vancomycin (VANCOCIN) IVPB 1000 mg/200 mL premix  Status:  Discontinued     1,000 mg 200 mL/hr over 60 Minutes Intravenous  Once 06/25/13 0829 06/25/13 0830   06/25/13 0800  cefTRIAXone (ROCEPHIN) 1 g in dextrose 5 % 50 mL IVPB  Status:  Discontinued     1 g 100 mL/hr over 30 Minutes Intravenous Every 24 hours 06/25/13 0746 06/25/13 0817   06/25/13 0545  cefTRIAXone (ROCEPHIN) 1 g in dextrose 5 % 50 mL IVPB     1 g 100 mL/hr over 30 Minutes Intravenous  Once 06/25/13 0532 06/25/13 0634      Current Facility-Administered Medications  Medication Dose Route Frequency Provider Last Rate Last Dose  . [START ON 06/26/2013] cefTRIAXone (ROCEPHIN) 1 g in dextrose 5 % 50 mL IVPB  1 g Intravenous Q24H Megan E Docherty, MD      . insulin aspart (novoLOG) injection 0-5 Units  0-5 Units Subcutaneous QHS Belkys A Regalado, MD      . insulin aspart (novoLOG) injection 0-9 Units  0-9 Units Subcutaneous TID WC Belkys A Regalado, MD      . vancomycin (VANCOCIN) IVPB 750 mg/150 ml premix  750 mg Intravenous Q24H Thuy Dien Dang, RPH       Current Outpatient  Prescriptions  Medication Sig Dispense Refill  . acetaminophen (TYLENOL) 500 MG tablet Take 1,000 mg by mouth every 4 (four) hours as needed for pain.      . chlorthalidone (HYGROTON) 25 MG tablet Take 25 mg by mouth daily.      . glimepiride (AMARYL) 4 MG tablet Take 4 mg by mouth daily before breakfast.      . ibuprofen (ADVIL,MOTRIN) 200 MG tablet Take 200 mg by mouth daily as needed for pain.      . insulin aspart (NOVOLOG FLEXPEN) 100 UNIT/ML SOPN FlexPen Inject 6-10 Units into the skin 3 (three) times daily with meals. Based on sugar levels      . Insulin Glargine (LANTUS SOLOSTAR) 100 UNIT/ML SOPN Inject 20-30 Units into the skin at bedtime.      . lisinopril (PRINIVIL,ZESTRIL) 5 MG tablet Take 5 mg by mouth daily.      . PRESCRIPTION MEDICATION Place 1 drop into the right eye 4 (four) times daily. Eye drops from [facility]      . simvastatin (ZOCOR) 20 MG tablet Take 20 mg by mouth daily.       I have discussed his case with Dr. Regalado.  I have reviewed his labs and CT films including those dating back to 2008.   On my review there is some abnormality in the area of the Left seminal vesical and there is a central nodule off of the prostate.  He has some calcifications in the area of the SV and I can't really say that he has a stone.  On his CT in 2008 he had some left distal ureteral dilation with bladder wall thickening.     Assessment: I am no sure that he has a stone as the cause for obstruction and this might be a chronic issue.   His pain is not urologic but is more in the sacroiliac area, but I see no bone lesions on CT.   He does have appear to have a UTI with early sepsis but is doing better with initial hydration and antibiotic.   I have a concern that he has a prostate cancer   based on his prostate exam and he could have some ureteral obstruction related to this.    Plan: I am going to order a PSA. I don't think he needs cystoscopy with stenting or perc tube placement just  yet but if he worsens, A perc might be the best bet since his ureteral obstruction could be from tumor.  With the location of the pain a lumbosacral MRI might be of value to assess for a disc herniation or possible tumor  He has been seen and evaluated recently at [facility] for the anemia and it would be helpful to have those records.   I have put in the order for the records request.  I will continue to follow.   CC: Dr. Regalado.     LOS: 0 days    Ashton Belote J 06/25/2013  

## 2013-07-11 NOTE — Brief Op Note (Signed)
07/11/2013  9:29 AM  PATIENT:  Miguel Leblanc  67 y.o. male  PRE-OPERATIVE DIAGNOSIS:  PROSTATE NODULE AND HYDRONEPHROSIS  POST-OPERATIVE DIAGNOSIS:  PROSTATE NODULE, Bilateral HYDRONEPHROSIS, BLADDER LESIONS. PROCEDURE:  Procedure(s): PROSTATE ULTRASOUND AND BIOPSY (N/A) CYSTOSCOPY WITH BILATERAL RETROGRADE PYELOGRAM, BILATERAL STENT PLACEMENT (Bilateral) CYSTOSCOPY WITH BIOPSY AND FULGURATION (RIGHT TRIGONE AND POST WALL)  SURGEON:  Surgeon(s) and Role:    * Bjorn Pippin, MD - Primary  PHYSICIAN ASSISTANT:   ASSISTANTS: none   ANESTHESIA:   general  EBL:  Total I/O In: 750 [I.V.:750] Out: -   BLOOD ADMINISTERED:none  DRAINS: bilateral 6 x 24 JJ stents   LOCAL MEDICATIONS USED:  LIDOCAINE   SPECIMEN:  Source of Specimen:  prostate biopsy cores and bladder biopsies  DISPOSITION OF SPECIMEN:  PATHOLOGY  COUNTS:  YES  TOURNIQUET:  * No tourniquets in log *  DICTATION: .Other Dictation: Dictation Number [id number]  PLAN OF CARE: Discharge to home after PACU  PATIENT DISPOSITION:  PACU - hemodynamically stable.   Delay start of Pharmacological VTE agent (>24hrs) due to surgical blood loss or risk of bleeding: not applicable

## 2013-07-12 ENCOUNTER — Encounter (HOSPITAL_BASED_OUTPATIENT_CLINIC_OR_DEPARTMENT_OTHER): Payer: Self-pay | Admitting: Urology

## 2013-07-13 LAB — GLUCOSE, CAPILLARY: Glucose-Capillary: 37 mg/dL — CL (ref 70–99)

## 2013-07-19 ENCOUNTER — Ambulatory Visit: Payer: Self-pay | Admitting: Oncology

## 2013-07-28 ENCOUNTER — Ambulatory Visit: Payer: Self-pay

## 2013-08-27 ENCOUNTER — Ambulatory Visit: Payer: Self-pay | Admitting: Urology

## 2013-08-27 ENCOUNTER — Inpatient Hospital Stay: Payer: Self-pay | Admitting: Internal Medicine

## 2013-08-27 LAB — CBC
HCT: 28.8 % — ABNORMAL LOW (ref 40.0–52.0)
MCH: 27.6 pg (ref 26.0–34.0)
MCHC: 34 g/dL (ref 32.0–36.0)
MCV: 81 fL (ref 80–100)
Platelet: 381 10*3/uL (ref 150–440)
RBC: 3.54 10*6/uL — ABNORMAL LOW (ref 4.40–5.90)
RDW: 17.6 % — ABNORMAL HIGH (ref 11.5–14.5)

## 2013-08-27 LAB — URINALYSIS, COMPLETE
Glucose,UR: 150 mg/dL (ref 0–75)
Hyaline Cast: 9
Ketone: NEGATIVE
Nitrite: NEGATIVE
Ph: 5 (ref 4.5–8.0)
Protein: 100
WBC UR: 23 /HPF (ref 0–5)

## 2013-08-27 LAB — COMPREHENSIVE METABOLIC PANEL
BUN: 96 mg/dL — ABNORMAL HIGH (ref 7–18)
Calcium, Total: 8.8 mg/dL (ref 8.5–10.1)
Chloride: 98 mmol/L (ref 98–107)
Co2: 24 mmol/L (ref 21–32)
Creatinine: 4.36 mg/dL — ABNORMAL HIGH (ref 0.60–1.30)
EGFR (African American): 15 — ABNORMAL LOW
EGFR (Non-African Amer.): 13 — ABNORMAL LOW
Glucose: 194 mg/dL — ABNORMAL HIGH (ref 65–99)
Potassium: 4.2 mmol/L (ref 3.5–5.1)
SGOT(AST): 22 U/L (ref 15–37)
Sodium: 128 mmol/L — ABNORMAL LOW (ref 136–145)
Total Protein: 7.3 g/dL (ref 6.4–8.2)

## 2013-08-27 LAB — HEPATIC FUNCTION PANEL A (ARMC)
Alkaline Phosphatase: 144 U/L — ABNORMAL HIGH (ref 50–136)
Bilirubin, Direct: <0.1 mg/dL (ref 0.00–0.20)
Total Protein: 7.4 g/dL (ref 6.4–8.2)

## 2013-08-27 LAB — MAGNESIUM: Magnesium: 2.1 mg/dL

## 2013-08-28 ENCOUNTER — Ambulatory Visit: Payer: Self-pay | Admitting: Internal Medicine

## 2013-08-28 LAB — BASIC METABOLIC PANEL
Anion Gap: 8 (ref 7–16)
BUN: 71 mg/dL — ABNORMAL HIGH (ref 7–18)
Calcium, Total: 8.1 mg/dL — ABNORMAL LOW (ref 8.5–10.1)
Co2: 20 mmol/L — ABNORMAL LOW (ref 21–32)
Creatinine: 3.05 mg/dL — ABNORMAL HIGH (ref 0.60–1.30)
EGFR (African American): 23 — ABNORMAL LOW
Glucose: 64 mg/dL — ABNORMAL LOW (ref 65–99)
Potassium: 5 mmol/L (ref 3.5–5.1)
Sodium: 135 mmol/L — ABNORMAL LOW (ref 136–145)

## 2013-08-28 LAB — CBC WITH DIFFERENTIAL/PLATELET
Basophil %: 0.6 %
Eosinophil #: 0.1 10*3/uL (ref 0.0–0.7)
HCT: 28 % — ABNORMAL LOW (ref 40.0–52.0)
Lymphocyte #: 0.7 10*3/uL — ABNORMAL LOW (ref 1.0–3.6)
MCHC: 33.4 g/dL (ref 32.0–36.0)
MCV: 81 fL (ref 80–100)
Monocyte #: 0.5 x10 3/mm (ref 0.2–1.0)
Neutrophil %: 87.8 %
Platelet: 334 10*3/uL (ref 150–440)
RDW: 17.6 % — ABNORMAL HIGH (ref 11.5–14.5)

## 2013-08-28 LAB — HEMOGLOBIN A1C: Hemoglobin A1C: 8.3 % — ABNORMAL HIGH (ref 4.2–6.3)

## 2013-08-29 LAB — BASIC METABOLIC PANEL
Calcium, Total: 8.3 mg/dL — ABNORMAL LOW (ref 8.5–10.1)
Chloride: 107 mmol/L (ref 98–107)
Creatinine: 2.17 mg/dL — ABNORMAL HIGH (ref 0.60–1.30)
EGFR (African American): 35 — ABNORMAL LOW
Glucose: 205 mg/dL — ABNORMAL HIGH (ref 65–99)
Osmolality: 294 (ref 275–301)
Sodium: 136 mmol/L (ref 136–145)

## 2013-08-29 LAB — TSH: Thyroid Stimulating Horm: 3.26 u[IU]/mL

## 2013-08-29 LAB — CBC WITH DIFFERENTIAL/PLATELET
Basophil %: 0.7 %
Eosinophil %: 1 %
HGB: 8.8 g/dL — ABNORMAL LOW (ref 13.0–18.0)
Lymphocyte %: 5.2 %
MCH: 26.6 pg (ref 26.0–34.0)
MCHC: 32.3 g/dL (ref 32.0–36.0)
MCV: 83 fL (ref 80–100)
Monocyte #: 0.5 x10 3/mm (ref 0.2–1.0)
Monocyte %: 4 %
Neutrophil #: 10.3 10*3/uL — ABNORMAL HIGH (ref 1.4–6.5)
Neutrophil %: 89.1 %
RBC: 3.3 10*6/uL — ABNORMAL LOW (ref 4.40–5.90)
WBC: 11.6 10*3/uL — ABNORMAL HIGH (ref 3.8–10.6)

## 2013-08-29 LAB — URINE CULTURE

## 2013-08-29 LAB — IRON AND TIBC
Iron Bind.Cap.(Total): 114 ug/dL — ABNORMAL LOW (ref 250–450)
Iron Saturation: 15 %
Unbound Iron-Bind.Cap.: 97 ug/dL

## 2013-08-29 LAB — FERRITIN: Ferritin (ARMC): 844 ng/mL — ABNORMAL HIGH (ref 8–388)

## 2013-08-30 LAB — CBC WITH DIFFERENTIAL/PLATELET
Basophil #: 0.1 10*3/uL (ref 0.0–0.1)
Basophil %: 0.7 %
Eosinophil #: 0.2 10*3/uL (ref 0.0–0.7)
Eosinophil %: 2.1 %
HCT: 29 % — ABNORMAL LOW (ref 40.0–52.0)
HGB: 9.4 g/dL — ABNORMAL LOW (ref 13.0–18.0)
Lymphocyte #: 0.8 10*3/uL — ABNORMAL LOW (ref 1.0–3.6)
MCH: 26.6 pg (ref 26.0–34.0)
MCV: 82 fL (ref 80–100)
Monocyte #: 0.5 x10 3/mm (ref 0.2–1.0)
Monocyte %: 4.7 %
Platelet: 314 10*3/uL (ref 150–440)
RBC: 3.53 10*6/uL — ABNORMAL LOW (ref 4.40–5.90)
RDW: 17.9 % — ABNORMAL HIGH (ref 11.5–14.5)

## 2013-08-30 LAB — BASIC METABOLIC PANEL
Anion Gap: 3 — ABNORMAL LOW (ref 7–16)
BUN: 40 mg/dL — ABNORMAL HIGH (ref 7–18)
Calcium, Total: 8.9 mg/dL (ref 8.5–10.1)
Creatinine: 1.62 mg/dL — ABNORMAL HIGH (ref 0.60–1.30)
EGFR (African American): 50 — ABNORMAL LOW
Potassium: 4.6 mmol/L (ref 3.5–5.1)
Sodium: 138 mmol/L (ref 136–145)

## 2013-08-31 LAB — BASIC METABOLIC PANEL
Anion Gap: 7 (ref 7–16)
BUN: 32 mg/dL — ABNORMAL HIGH (ref 7–18)
Calcium, Total: 8.4 mg/dL — ABNORMAL LOW (ref 8.5–10.1)
Creatinine: 1.53 mg/dL — ABNORMAL HIGH (ref 0.60–1.30)
EGFR (African American): 54 — ABNORMAL LOW
Glucose: 67 mg/dL (ref 65–99)
Osmolality: 283 (ref 275–301)

## 2013-09-01 LAB — BASIC METABOLIC PANEL
Anion Gap: 5 — ABNORMAL LOW (ref 7–16)
Calcium, Total: 8.3 mg/dL — ABNORMAL LOW (ref 8.5–10.1)
Chloride: 110 mmol/L — ABNORMAL HIGH (ref 98–107)
Co2: 23 mmol/L (ref 21–32)
Creatinine: 1.31 mg/dL — ABNORMAL HIGH (ref 0.60–1.30)
EGFR (Non-African Amer.): 56 — ABNORMAL LOW
Glucose: 202 mg/dL — ABNORMAL HIGH (ref 65–99)
Osmolality: 285 (ref 275–301)
Potassium: 4.6 mmol/L (ref 3.5–5.1)
Sodium: 138 mmol/L (ref 136–145)

## 2013-09-01 LAB — CULTURE, BLOOD (SINGLE)

## 2013-09-02 LAB — PLATELET COUNT: Platelet: 269 10*3/uL (ref 150–440)

## 2013-09-18 ENCOUNTER — Ambulatory Visit: Payer: Self-pay | Admitting: Internal Medicine

## 2013-09-18 DEATH — deceased

## 2014-10-13 IMAGING — CT CT ABD-PELV W/O CM
2 of 4 series · 16 of 46 positions shown, 18 images · non-contrast
Comparison: 08/20/2007

CLINICAL DATA: Left flank pain starting 2 weeks ago.  Known kidney
stones.

CT ABDOMEN AND PELVIS WITHOUT CONTRAST
TECHNIQUE: Multidetector CT imaging of the abdomen and pelvis was
performed following the standard protocol without intravenous
contrast.

[Series 2: abd/ pelvis 5.0 i30f 1 · axial · 0.62mm/px · z∈[+711,+1081]mm · 13 of 82 slices shown, 15 images]
[im 4/82  soft-tissue]
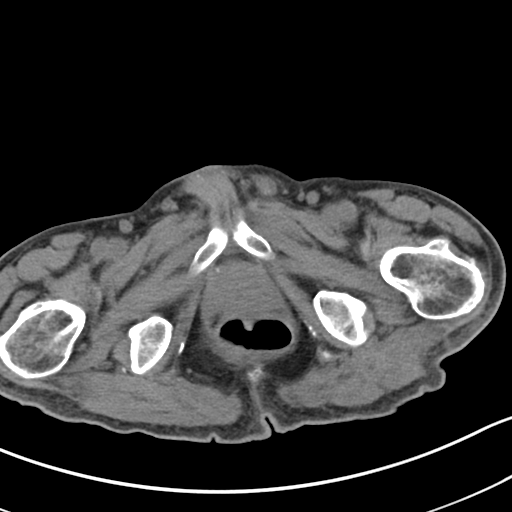
[im 4/82  bone]
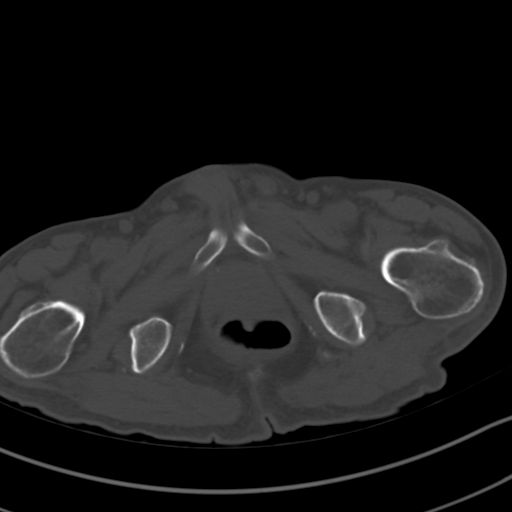
[im 11/82  soft-tissue]
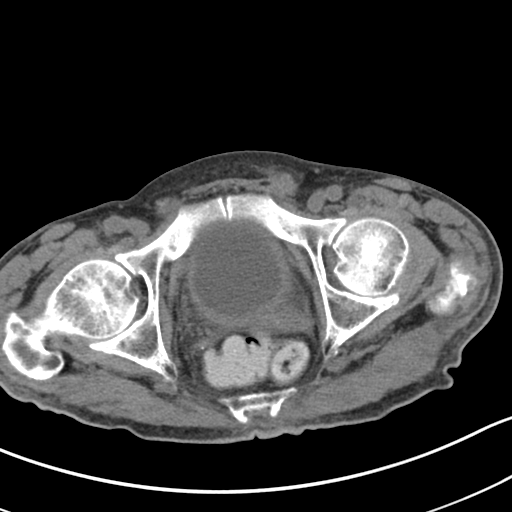
[im 17/82  soft-tissue]
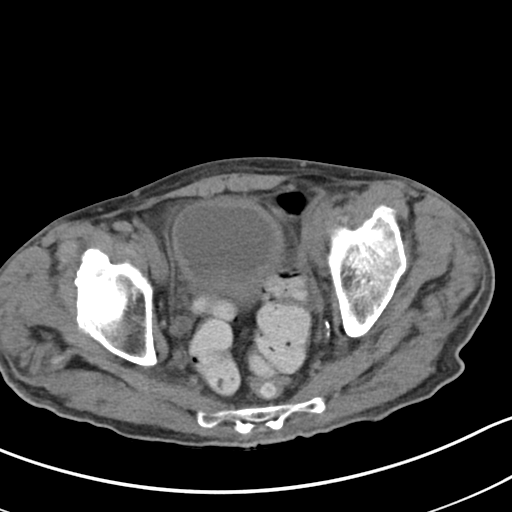
[im 24/82  soft-tissue]
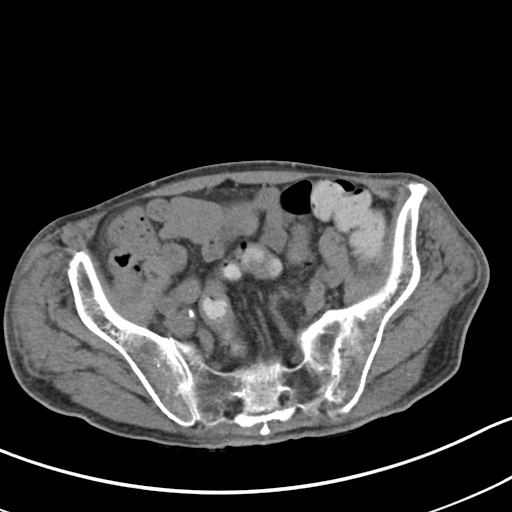
[im 28/82  soft-tissue]
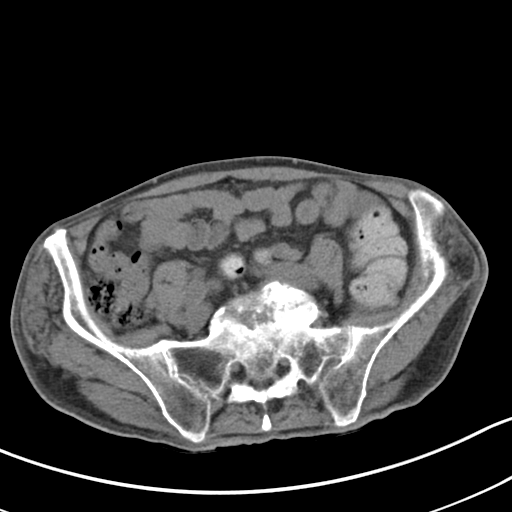
[im 34/82  soft-tissue]
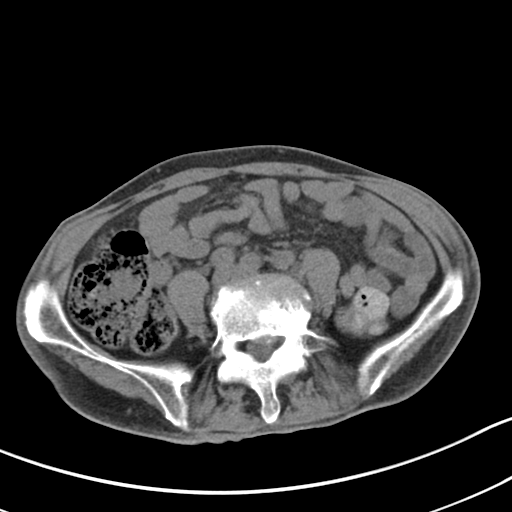
[im 41/82  soft-tissue]
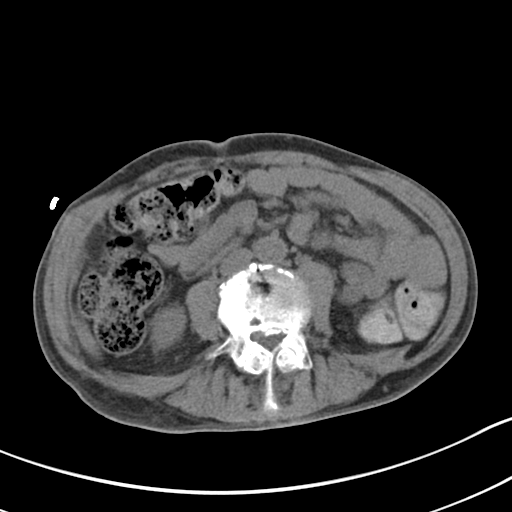
[im 48/82  soft-tissue]
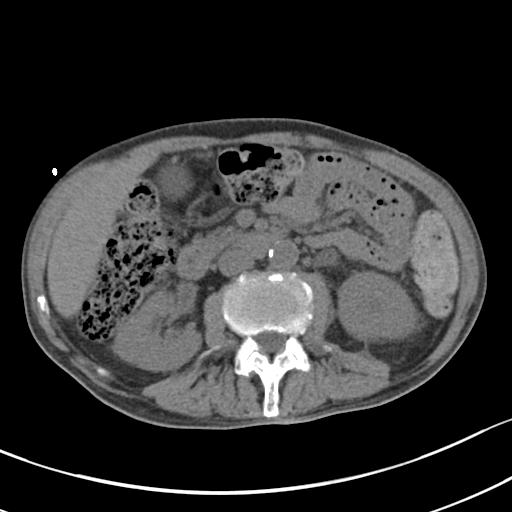
[im 55/82  soft-tissue]
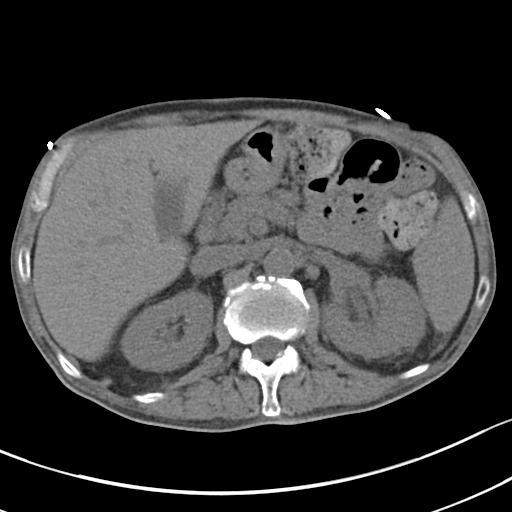
[im 55/82  bone]
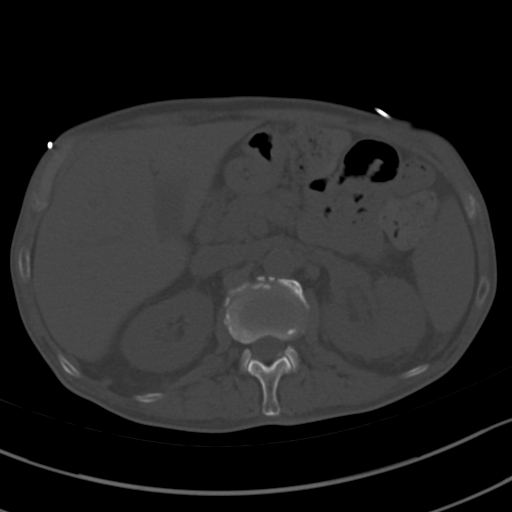
[im 58/82  soft-tissue]
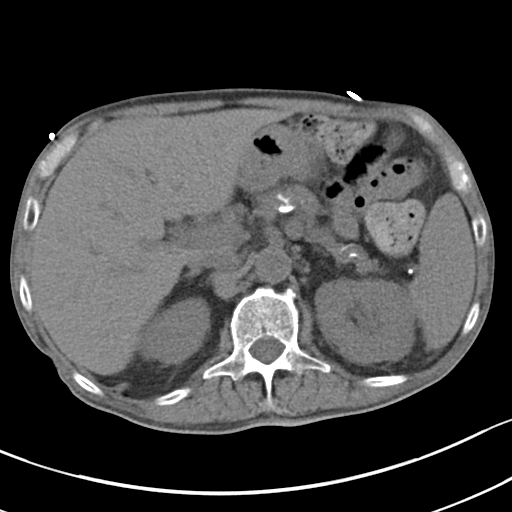
[im 65/82  soft-tissue]
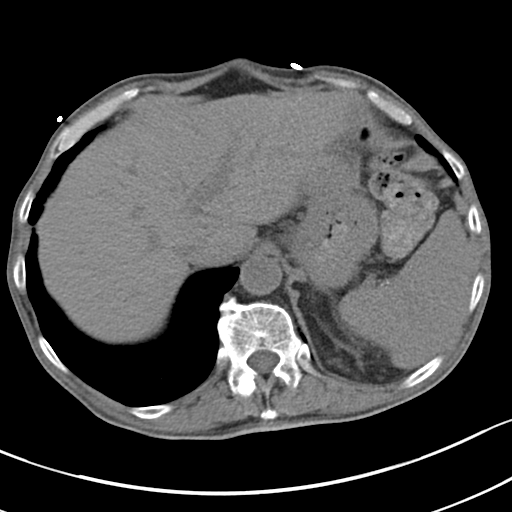
[im 71/82  soft-tissue]
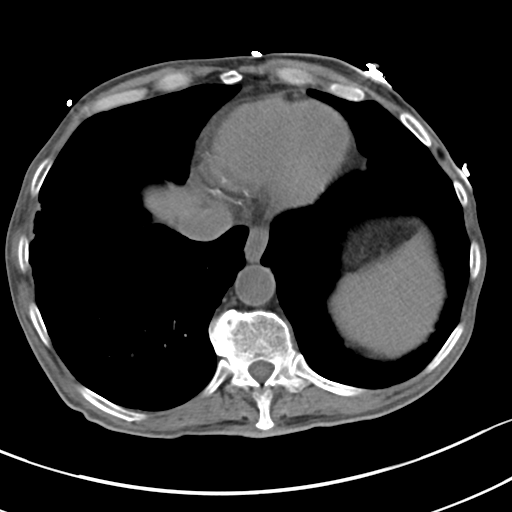
[im 78/82  soft-tissue]
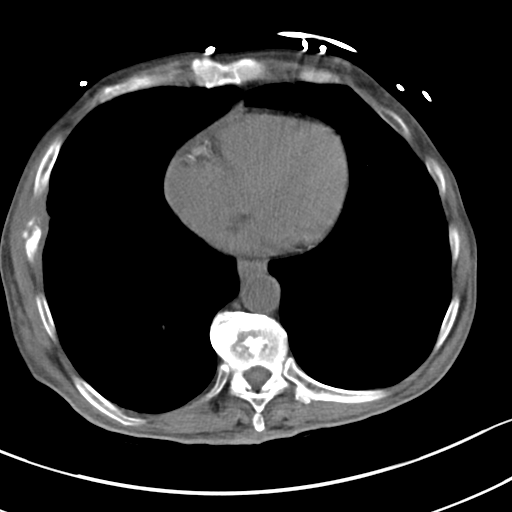

[Series 5: cor st · coronal · 0.80mm/px · 3 of 74 slices shown]
[im 25/74  soft-tissue]
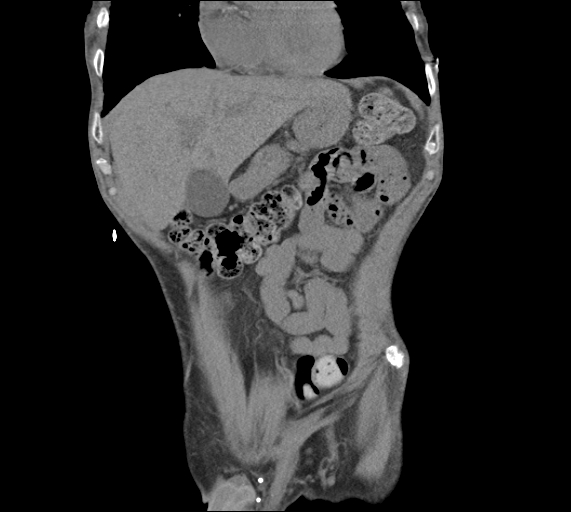
[im 33/74  soft-tissue]
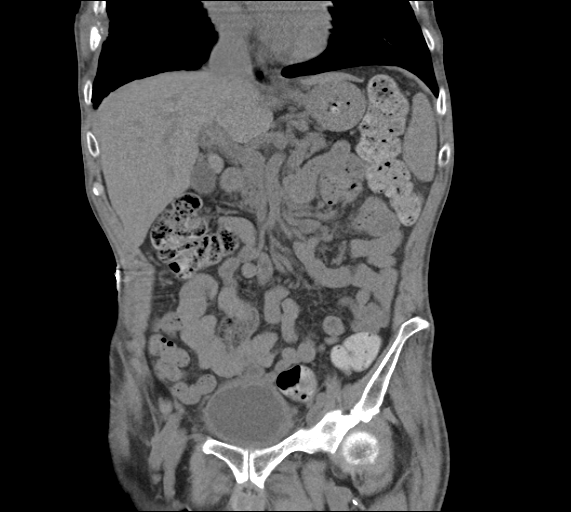
[im 41/74  soft-tissue]
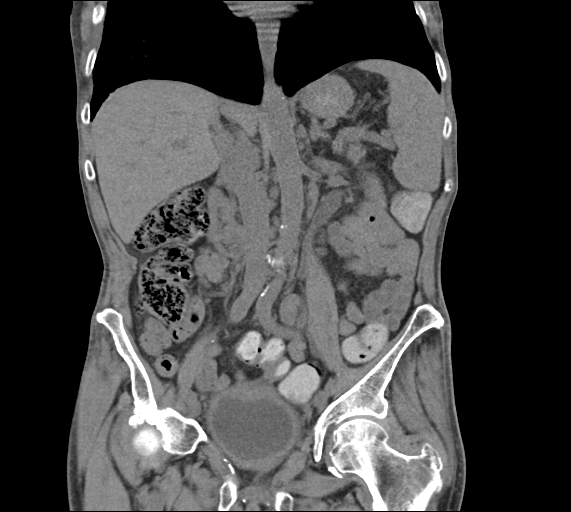

[16 of 46 positions shown; findings below may reference images not displayed]

FINDINGS: Small focal areas of tree in bud infiltrates in the right
lung base may represent alveolitis.  Healing fracture of the right
lower anterior rib.  Coronary artery calcifications.

Punctate sized nonobstructing intrarenal stones bilaterally.  There
is left-sided pyelocaliectasis and ureterectasis with left
pararenal and periureteral stranding.  There is a punctate sized
stone in the distal left ureter just above the ureterovesicle
junction.  No right-sided pyelocaliectasis or ureterectasis.  No
right ureteral or bladder stones.  The bladder wall is thickened
suggesting cystitis.  Left pyelonephritis is not excluded.

The unenhanced appearance of the liver, spleen, gallbladder,
pancreas, adrenal glands, abdominal aorta, and inferior vena cava
are unremarkable except for vascular calcifications.  The stomach
and small bowel are decompressed.  Stool filled colon without
distension.  No free air or free fluid in the abdomen.

Pelvis:  Rectosigmoid colon is contrast filled.  No evidence of
diverticulitis.  No free or loculated pelvic fluid collections.
Mild enlargement of the prostate gland.  Appendix is not
identified.  Degenerative changes in the lumbar spine and hips.  No
destructive bone lesions appreciated.
IMPRESSION: Punctate sized stone in the distal left ureter with moderate
proximal obstruction.  There is diffuse bladder wall thickening
which may represent cystitis.  Periureteral and pararenal stranding
on the left is probably due to obstruction but pyelonephritis is
not excluded.  Bilateral nonobstructing intrarenal stones.

## 2015-02-08 NOTE — H&P (Signed)
PATIENT NAME:  SEMAJ, COBURN MR#:  [id number] DATE OF BIRTH:  Mar 04, 1946  DATE OF ADMISSION:  08/27/2013  PRIMARY CARE PHYSICIAN: Dr. Brunetta Genera.   REFERRING PHYSICIAN: Dr. Kerman Passey   CHIEF COMPLAINT: Referred by kidney doctor for abnormal labs.   HISTORY OF PRESENT ILLNESS: The patient is a pleasant 68 year old male who was living at [facility] for a couple of months. He was referred to come to the ER by Dr. Holley Raring, his nephrologist after some abnormal labs couple of days ago where his BUN and creatinine was significantly elevated from his baseline, with creatinine greater than 5. Here, he was noted to have creatinine in the 4s and GFR in the teens and hospitalist services were contacted for further evaluation and management.  On reviewing on some records that came in with the patient,  it appears that the patient has a complicated GU story in the last several months, including prostate biopsy and cystoscopy and finding of extensive cystitis, bilateral distal ureteral obstruction that required stenting. 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 Apparently the patient has also been on Bactrim for a couple of months as well. The patient feels weak and has lost about 20 pounds. He does not like the food at the facility. Otherwise, he has no significant complaints.   PAST MEDICAL HISTORY: Chronic renal failure, hypertension, diabetes; recurrent nephrolithiasis, history of sepsis with pyelonephritis, chronic right lower extremity ulcers. Multiple toe amputations, hypercholesterolemia, history of distal ureteral obstruction status post stenting a few months ago, depression, arthritis, chronic anemia requiring multiple transfusions.   FAMILY HISTORY:  Dad with kidney disease, heart disease, diabetes also running in the family.   SOCIAL HISTORY: Ex-smoker no alcohol or drug use been living at the facility for couple of months.   OUTPATIENT  MEDICATIONS: Acetaminophen 500 mg 2 tabs every four hours as needed for pain,  and acetaminophen/hydrocodone 325/5 mg 1 to 2 tabs every four hours as needed for pain, atorvastatin 10 mg daily, docusate senna 1 tab 2 times a day as needed ferrous sulfate, 325 mg 3 times a day, glimepiride 4 mg daily, guaifenesin p.r.n., Lantus 20 units at bedtime, sliding scale insulin, NovoLog 70/30 40 units once a day, Zofran p.r.n., Pyridium every eight hours 1 tab 200 milligrams as needed, promethazine p.r.n., Bactrim 1 tab once a day 800/160 mg oral tab.   REVIEW OF SYSTEMS:    CONSTITUTIONAL: No fevers or chills, but 20 pounds weight loss in the last couple of months.  EYES: No blurry vision or double vision.  ENT: No tinnitus or hearing loss. Has some sore throat in the last day or so.  RESPIRATORY: No cough, wheezing, shortness of breath, or hemoptysis.  CARDIOVASCULAR: No chest pain, swelling in the legs, arrhythmia.  GASTROINTESTINAL: No nausea, vomiting, diarrhea. The patient does have some abdominal pain in the lower quadrant.  GENITOURINARY: Denies dysuria or hematuria.  ENDOCRINE: Denies polyuria or nocturia.  HEMATOLOGIC AND LYMPHATIC: Has chronic anemia.  SKIN: No rashes.  MUSCULOSKELETAL: Chronic arthritis, chronic back pain and bilateral a hip pain.  NEUROLOGIC: No focal weakness or numbness. Has global weakness.  PSYCHIATRIC: Has a history of depression.   PHYSICAL EXAMINATION: VITAL SIGNS: His temperature on arrival is 97.8, pulse rate 83, initial blood pressure 86/66, last blood pressure 119/85, oxygen saturation 100% on room air.  GENERAL: The patient has cachectic-appearing thin Caucasian male without distress, lying in bed.  HEENT: Normocephalic, atraumatic. Significant temporal wasting. Pupils equal. Extraocular muscles  intact. Moist mucous membranes.  NECK: Supple. No thyroid tenderness. No cervical lymphadenopathy.  CARDIOVASCULAR: S1, S2 irregularly irregular. No significant murmurs  appreciated.  LUNGS: Clear to auscultation. No crackles or wheezing.  ABDOMEN: Soft, nontender, nondistended.  EXTREMITIES: No significant lower extremity edema. The patient has multiple digit amputation on the right lower extremity and also some parts of his forefoot on the right.  NEUROLOGIC: Cranial nerves II through XII are still grossly intact. Strength is 5/5 in all extremities. So PSYCHIATRIC: Awake, alert, oriented x 3.   LABORATORY, DIAGNOSTIC AND RADIOLOGIC DATA:  No nitrites, 2+ leukocyte esterase, 70 RBCs, 23 WBCs, trace bacteria. WBC 10.4, hemoglobin 9.8, and platelets 381. LFTs showed albumin of 2.2, ALT 99, otherwise within normal limits. BUN 96, creatinine 4.36, sodium 128.   Ultrasound of kidneys bilaterally done in the ER shows mild to moderate bilateral hydronephrosis, double-J ureteral stents, large postvoid residual.   EKG: Normal sinus rhythm, rate is 79. No acute ST elevations or depressions.   ASSESSMENT AND PLAN: We have a 69 year old with a complicated  genitourinary history, recently status post stenting and cystoscopy for obstructive uropathy, diabetes and chronic anemia who is referred for worsening renal function. The patient appears to have possible obstructive uropathy with acute renal failure. There could be some bladder outlet obstruction. Will go ahead and place a Foley as there is a large postvoid residual and I did discuss the case with Dr. Maudie Mercury. I would place a urology consult, hold the Bactrim and start the patient on some IV fluids. The patient has had significant weight loss recently and is not eating well and I believe intravascular volume depletion, in addition to obstruction and Bactrim are all contributing in the patient's renal failure. Therefore holding the Bactrim, starting the patient on some fluids and placing the Foley at this point will be ordered and will follow with the kidney function obtained a nephrology consult as well. The patient also has mild  hyponatremia which likely is secondary to poor oral intake. I would follow with that as well tomorrow. I would cut down on the insulin at this point, continue the Lantus, but hold on the 70/30, which I do not know why he is on both. I would check hemoglobin A1c and start the patient on sliding scale insulin. His anemia appears to be chronic and stable. He has severe malnutrition and weight loss. I would obtain a dietary consult. He does appear to have dirty urine with 2+ leukocyte esterase, and 23 WBCs, trace bacteria. I would obtain a urine culture. Blood cultures have been obtained by the ER physician, and the patient has received some ceftriaxone, which I would continue. The patient's significant weight loss should be addressed as well. This could be done as an outpatient.   CODE STATUS: Full code.   Total Time Spent: 50 minutes    ____________________________ Vivien Presto, MD sa:cc D: 08/27/2013 16:58:15 ET T: 08/27/2013 18:12:23 ET JOB#: [id number]  cc: Vivien Presto, MD, <Dictator> Vivien Presto MD ELECTRONICALLY SIGNED 09/12/2013 15:27

## 2015-02-08 NOTE — Discharge Summary (Signed)
PATIENT NAME:  Miguel Leblanc, Miguel Leblanc MR#:  [id number] DATE OF BIRTH:  [birth year]  DATE OF ADMISSION:  08/27/2013 DATE OF DISCHARGE:  09/04/2013  HISTORY OF PRESENT ILLNESS: Please see discharge summary done by Dr. Verdell Carmine on 09/01/2013. The patient has been approved by his insurance for returning back to skilled facility today. We will be discharging the patient to skilled facility today. The patient is overall improving slowly. On his most recent labs, glucose is 202, BUN is 24, creatinine is 1.31, sodium is 138, potassium is 4.6, chloride is 110, bicarbonate is 23 and calcium is 8.3. Hemoglobin and hematocrit is 9.4 and 29.0.   DISCHARGE INSTRUCTIONS: Diet is regular with Ensure 3 times a day. Speech. Dietitian to follow at the facility. Continue physical therapy. Follow up with Dr. Grayland Ormond oncology in 1 to 2 weeks. Follow up with [facility]. Jeffie Pollock at [facility] urology in 1 to 2 weeks.   CODE STATUS: No code, DNR.   FINAL MEDICATION LIST:  1.  Norco 5/325, 1 tablet q.4 p.r.n.  2.  Atorvastatin 10 mg at bedtime.  3.  Ferrous sulfate 325 p.o. t.i.d.  4.  Analgesic balm apply to affected area b.i.d. p.r.n.  5.  Protonix 40 mg daily.  6.  Calcium carbonate 1000 mg q.i.d. p.r.n. for indigestion.  7.  Glimepiride 4 mg daily.  8.  Lantus 20 units subcutaneous q.24 hours.  9.  Zofran 4 mg q.6 hours p.r.n.  10. Senokot-S 1 tablet b.i.d. p.r.n.   TIME SPENT: 40 minutes.   ____________________________ Hart Rochester Posey Pronto, MD sap:aw D: 09/04/2013 10:06:49 ET T: 09/04/2013 10:55:09 ET JOB#: [id number]  cc: Zadok Holaway A. Posey Pronto, MD, <Dictator> Vivek J. Verdell Carmine, MD [place] Grayland Ormond, MD  Dr. Jeffie Pollock [facility] Gus Height Hulda Humphrey MD ELECTRONICALLY SIGNED 09/15/2013 14:13

## 2015-02-08 NOTE — Consult Note (Signed)
   Comments   Had a lengthy conversation with pt's step daughter. Patient does not seem appropriate for the [facility]ospice [facility]. Family likely unable to care for him at home. The best scenario would be for patient to return to rehab and may need LTC. Hospice could be utilized if patient were to return home. However, I have requested that PT work with patient today and patient instructed to try his best to see if he can qualify for rehab. Daughter seems to feel that patient has a limited desire to participate in care although she says she had informed he was walking 300 feet prior to this hospitalization. She asks that I call pt's wife this afternoon to update her. Will follow.   Electronic Signatures: Borders, Kirt Boys (NP)  (Signed 12-Nov-14 11:51)  Authored: Palliative Care   Last Updated: 12-Nov-14 11:51 by Irean Hong (NP)

## 2015-02-08 NOTE — Consult Note (Signed)
Urology Consultation Report for Consultation: Acute Renal Insufficiency with b/L Hydro and Large PVR on [place] MD: Vivien Presto, M.D.MD: Darcella Cheshire, M.D. Local Urologist: Irine Seal, M.D. [facility], [place], [place]) per pt/family and Records from SNF (D/C Summary from [facility] 06/25/2013-07/04/2013 and Urology Note from 07/26/2013)and family are difficult historians with poor insight 69 y.o. MWM sent to the [facility] ER for evaluation by his nephrologist for acute on chronic renal insufficiency (Cr 5.76 on 08/25/2013).  In the [facility] ER, Cr 4.36, Na+ 128, K+ 4.2, Cl 98, CO2 24, WBC 10.4k, Hct 28.8%, Albumin 2.2, LFT's wnL, UA with 70 rbc/23 wbc/trace bacteria. RUS revealed bilateral mild-mod hydro with b/L ureteral stents in position and large bladder PVR.  Foley catheter was placed with 225 of cloudy amber urine obtained (121m upon insertion). [facility] with severe b/L hip pain and was found to have a "kidney stone" which he subsequently passed. However, during the hospitalization, he was found to have an MRSA infection.  During the 9 day hospitalization, the patient reports becoming progressively debilitated and was discharged to a SNF for rehab/PT.  One month ago, the pt reports [facility]  The patient does not know why he had surgery, nor is he aware of what was found.  The patient denies any LUTS, however, upon further questioning, reports small volume voids every 2 hrs for the past 2 days.  The pt's wife endorses that he's been having difficulty voiding for several months, inculding urge urinary incontinence.  The pt denies dysuria, gross hematuria, F/C, or change in his bowel habits.  The pt reports a long h/o recurrent urolithiasis with spontaneous passage. [facility] the patient had never been under  the care of a Urologist. the records accompanying the patient from  the [facility]F ([facility] and Urology Note from Dr. WJeffie Pollock10/05/2013), [facility] c/o severe LBP with a h/o passing small black stones for 3 weeks prior.  In the ER, he was hypotensive with systolic blood pressures in the 80's and in ARF.  Stone CT revealed a punctate stone just above the left UVJ with proximal moderate hydro. 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  UC&S grew out Staph Aureus, however, there are no sensitivities reported.  Blood Cultures were negative. Nasal swabs were positive for MRSA.  The patient was treated initially with IV Rocephin for 48hrs, then switched to IV Ancef for a 7 day course, per ID recommendations.  The patient was d/c'd on Bactrim in anticipation of an outpatient urologic procedure.  Pt was d/c'd with "normal" renal function. Dr. WRalene Muskratnote of 07/26/2013, the patient was found to have a prostate nodule on exam and a prominent left seminal vesicle on the CT.  PSA was "low." The patient underwent outpatient cystoscopy with prostate biopsies. 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 Pre-op Cr was 1.8. Bilateral ureteral stents were placed. 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  MRI revealed "abnL bone marrow" and the patient was referred for bone marrow biopsy.  Creatinine from 07/26/2013 returned rising at 2.67 and a RUS was planned.  [facility]  reve[facility] "minimal fullness" of the right collecting system. 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 ([place])         s/p Skin  Grafting Right Anterior LE 2008 [facility]) Negative for Prostate Cancer; Negative for UrolithiasisMarried; Denies smoking history (only an occas cigarette); h/o ETOHNKDAGlimepiride 72m po qDay;            Atorvastatin 151mpo qHS;            Lantus Solostar Pen 100u/mL, 20u SQ qHS;            Novolog 70/30 Flexpen, 40u SQ qDay;            Novolog Flexpen 100u/mL, SSI tid with meals;            Bactrim DS, 1 po qDay;            Ferrous Sulfate 32561mo tid with meals;            Acetominophen 500m38m po q6hrs prn;            Guaifenesin 100mg30m, 10mL 63m6hrs prn;            Vicodin (5/325), 1-2 po q4hrs prn;            Ondansetron 4mg ta45m 1 po q6hrs prn;            Promethazine 25mg IM75mrs prn;            Phenazopyridine 200mg po 62ms prn;            Docusate-senna bid prn constipation Denies F/C; Endorses weight loss (>20lbs/past year, from 120 to 98lbs)Sore throat today, no post-nasal drip, no congestion or coughDenies SOBDenies Chest PainDenies N/V, Blood per rectum or change in stool caliperper HPIsevere b/L hip painDenies focal weaknessSlowly healing lesion anterior right LEDenies anxiety/depressionDenies adenopathyDenies abnL bleeding/bruising  T 97.58F; P 83; BP 86/66; Pulse Ox 98% on RAWD, cachectic, WM in NADWarm/Dry; no lesions about the Head/Neckno cervical/supraclavicular adenopathyNC/AT, EOMI, anictericno masses or bruitsnL Respiratory Effort; NTno palpable thrill; 1+ Radial pulsessoft/flat, NT, no palpable masses/organomegaly; no CVATnL circ phallus without lesion (foley in place draining cloudy amber urine); b/L descended testes - NT, no masses       DRE: decreased tone, no prostate enlargement - NTno edemanon-focalA&O x3, cooperative, poor insight - per HPI  Acute renal insufficiency - only with 100mL upon72mtial foley placement, despite "large PVR" on RUS with an increase in b/L hydro; may still be due to voiding inefficiency with decreased bladder wall compliance resulting in  increased intravesical pressures. Pt is also clinically dehydrated with a contraction alkalosis on his chemistries.  The lack of a metabolic acidosis makes chonic renal insufficiency unlikely. LUTS - no prostate enlargement on exam, however, with extensive cystitis cystica on recent cysto with b/L distal ureteral obstruction. b/L Distal Ureteral Obstruction - ? due to extensive Cystitis Cystica vs atypical retroperitoneal fibrosis ([facility]). Increased hydro on RUS (compared with RUS 07/28/2013) - ? due to decreased bladder compliance with increased intravesical pressures, even with modest PVR's (see #1). h/o Staph Aureus UTI 06/25/2013 - equivocal clinical signs of infection (Afeb, no leukocytosis, no dysuria or gross hematuria; decreased BP upon presentation likely due to dehydration; UA with rbc's/wbc's likely due  to chronic indwelling b/L ureteral stents)  Agree with foley catheter bladder decompressionMonitor Urine Output and serum creatinine closely    -if Creatinine/Hydro does not improve with foley bladder decompression, recommend b/L PCN'sIV HydrationF/U UC&S - agree with IV Rocephin empiricallyTamsulosin 0.34m po qDay after the same meal, when BP stabilizes with SBP >100.F/U with [facility] (Dr. WJeffie Pollock after discharge RRick Duffwill assume Urologic Care at 5am Monday morning, 08/28/2013.  Electronic Signatures: KDarcella Cheshire(MD)  (Signed on 09-Nov-14 19:32)  Authored  Last Updated: 09-Nov-14 19:32 by KDarcella Cheshire(MD)

## 2015-02-08 NOTE — Consult Note (Signed)
History of Present Illness:  Reason for Consult Anemia   HPI   Patient last seen in clinic in September 2014.  Since that time his performance status has significantly declined and he is now essentially bedridden.  Patient recently admitted to the hospital and found to have acute renal failure as well as anemia.  [name] only complaint is of "joint pain".  He has no neurologic complaints.  He is significantly weak and fatigued.  He denies any recent fevers.  He has a poor appetite and continues to lose weight.  He did not complaint chest pain or shortness of breath.  He denies any nausea, vomiting, constipation, or diarrhea.  Patient offers no further specific complaints today.  PFSH:  Additional Past Medical and Surgical History Chronic renal insufficiency, hypertension, diabetes, hypercholesterolemia, multiple toe amputations.  Family history: Diabetes.  Social history: Former tobacco, unclear how much.  Denies alcohol.   Review of Systems:  Performance Status (ECOG) 3   Review of Systems   As per HPI. Otherwise, 10 point system review was negative.   NURSING NOTES: **Vital Signs.:   11-Nov-14 03:52   Vital Signs Type: Routine   Temperature Temperature (F): 97.8   Celsius: 36.5   Temperature Source: oral   Pulse Pulse: 84   Respirations Respirations: 20   Systolic BP Systolic BP: 263   Diastolic BP (mmHg) Diastolic BP (mmHg): 64   Mean BP: 76   Pulse Ox % Pulse Ox %: 100   Pulse Ox Activity Level: At rest   Oxygen Delivery: Room Air/ 21 %   Physical Exam:  Physical Exam General: cachectic, no acute distress. Eyes: Pink conjunctiva, anicteric sclera. Lungs: Clear to auscultation bilaterally. Heart: Regular rate and rhythm. No rubs, murmurs, or gallops. Abdomen: Soft, normoactive bowel sounds. Musculoskeletal: No edema, cyanosis, or clubbing. Neuro: Alert, answering all questions appropriately. Cranial nerves grossly intact. Skin: No rashes or petechiae  noted. Psych: flat affect.    No Known Allergies:     glimepiride 4 mg oral tablet: 1 tab(s) orally once a day, Status: Active, Quantity: 0, Refills: None   atorvastatin 10 mg oral tablet: 1 tab(s) orally once a day (at bedtime), Status: Active, Quantity: 0, Refills: None   Lantus Solostar Pen 100 units/mL subcutaneous solution: 20 unit(s) subcutaneous once a day (at bedtime), Status: Active, Quantity: 0, Refills: None   NovoLOG Mix 70/30 FlexPen 30 units-70 units/mL subcutaneous suspension: 40 unit(s) subcutaneous once a day, Status: Active, Quantity: 0, Refills: None   sulfamethoxazole-trimethoprim 800 mg-160 mg oral tablet: 1 tab(s) orally once a day (at bedtime), Status: Active, Quantity: 0, Refills: None   ferrous sulfate 325 mg oral tablet: 1 tab(s) orally 3 times a day (with meals), Status: Active, Quantity: 0, Refills: None   acetaminophen 500 mg oral tablet: 2 tabs (1010m) orally every 4 hours as needed for pain, Status: Active, Quantity: 0, Refills: None   guaiFENesin 100 mg/5 mL oral liquid: 10 milliliter(s) orally every 6 hours as needed for cold symptoms., Status: Active, Quantity: 0, Refills: None   acetaminophen-HYDROcodone 325 mg-5 mg oral tablet: 1 to 2 tab(s) orally every 4 hours as needed for pain., Status: Active, Quantity: 0, Refills: None   ondansetron 4 mg oral tablet: 1 tab(s) orally every 6 hours, As needed for nausea, Status: Active, Quantity: 0, Refills: None   promethazine 25 mg/mL injectable solution: 1 milliliter (240m intramuscualr every 6 hours, As Needed - for Nausea, Vomiting, Status: Active, Quantity: 0, Refills: None   phenazopyridine 200 mg  oral tablet: 1 tab(s) orally every 8 hours as needed for urinary pain., Status: Active, Quantity: 0, Refills: None   docusate-senna 50 mg-8.6 mg oral tablet: 1 tab(s) orally 2 times a day as needed  for constipation., Status: Active, Quantity: 0, Refills: None   NovoLOG FlexPen 100 units/mL subcutaneous  solution: sliding scale subcutaneous 3 times a day (with meals): if 0-150=0 units, 151-200=2 units, 201-250=4 units, 251-300=6 units, 301-350=8 units, 351-400=10 units, Call MD if bs is higher than 400, Status: Active, Quantity: 0, Refills: None  Laboratory Results: Routine Chem:  11-Nov-14 05:18   Glucose, Serum  205  BUN  57  Creatinine (comp)  2.17  Sodium, Serum 136  Potassium, Serum 4.3  Chloride, Serum 107  CO2, Serum 23  Calcium (Total), Serum  8.3  Anion Gap  6  Osmolality (calc) 294  eGFR (African American)  35  eGFR (Non-African American)  30 (eGFR values <57m/min/1.73 m2 may be an indication of chronic kidney disease (CKD). Calculated eGFR is useful in patients with stable renal function. The eGFR calculation will not be reliable in acutely ill patients when serum creatinine is changing rapidly. It is not useful in  patients on dialysis. The eGFR calculation may not be applicable to patients at the low and high extremes of body sizes, pregnant women, and vegetarians.)  Routine Hem:  11-Nov-14 05:18   WBC (CBC)  11.6  RBC (CBC)  3.30  Hemoglobin (CBC)  8.8  Hematocrit (CBC)  27.2  Platelet Count (CBC) 304  MCV 83  MCH 26.6  MCHC 32.3  RDW  17.1  Neutrophil % 89.1  Lymphocyte % 5.2  Monocyte % 4.0  Eosinophil % 1.0  Basophil % 0.7  Neutrophil #  10.3  Lymphocyte #  0.6  Monocyte # 0.5  Eosinophil # 0.1  Basophil # 0.1 (Result(s) reported on 29 Aug 2013 at 06:14AM.)   Assessment and Plan: Impression:   Anemia. Plan:   1.  Anemia: Patient last received IV iron in September 2014 with no appreciable difference in his symptoms.  Since that point he has significantly declined.  Will repeat iron stores today for completeness.  Previously, for workup was essentially negative. 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  It is unlikely patient has progress to multiple myeloma, but even if he had, no treatment would be  possible given his significantly reduced performance status. Weight loss, declining performance status: Patient is severely cachectic.  There is a possibility of underlying malignancy, but again if this were diagnosed his performance status would prevent any significant treatment. consult, call with questions.   Electronic Signatures: FDelight Hoh(MD)  (Signed 11-Nov-14 14:11)  Authored: HISTORY OF PRESENT ILLNESS, PFSH, ROS, NURSING NOTES, PE, ALLERGIES, HOME MEDICATIONS, LABS, ASSESSMENT AND PLAN   Last Updated: 11-Nov-14 14:11 by FDelight Hoh(MD)

## 2015-02-08 NOTE — Consult Note (Signed)
Although patient's ferritin is elevated, it is likely secondary to an acute phase reactant.  His iron saturation and iron stores are decreased therefore will give 510 mg IV Feraheme today.    Electronic Signatures: Delight Hoh (MD)  (Signed on 12-Nov-14 09:00)  Authored  Last Updated: 12-Nov-14 09:00 by Delight Hoh (MD)

## 2015-02-08 NOTE — Consult Note (Signed)
   Comments   Spoke with pt's step-daughter and wife on the phone. Both agree that patient has declined. We discussed possibility that patient may not improve and that he could be approaching end of life. They seem to recognize that his long term prognosis seems poor. They are undecided as to if they will be able to take care of him at home. He was at rehab and daughter says that patient was not working with PT. They had hoped that he would return to his previous level of functioning and be able to return home. We talked about possible involvement of hospice if patient returns home and they would like information on sitters which could supplement hospice care. Will ask SW to assist with pt's discharge plan.  discussed code status. Wife and daughter agree with DNR which patient informed Dr Phifer and I yesterday that he would want. Will change code status to reflect this.  step daughter has concerns that patient is depressed. I tried talking with patient about this although he very adamently denies any depression "what in the world do I have to be depressed about". I also offered an appetite stimulant although he says "sometimes I am hungry and sometimes I am not, I am not worried about it".  20 minutes step daughter, Fransico Setters, can be reached at [phone] (cell) or [phone] (work)  Clinical biochemist for Addendum Section:  Phifer, Izora Gala (MD) (Signed Addendum 11-Nov-14 19:43)  Discussed with Billey Chang, NP, in detail. Agree with assessment and plan as outlined in above note.   Electronic Signatures: Borders, Kirt Boys (NP)  (Signed 11-Nov-14 17:31)  Authored: Palliative Care   Last Updated: 11-Nov-14 19:43 by Phifer, Izora Gala (MD)
